# Patient Record
Sex: Female | Born: 1990 | Race: White | Hispanic: No | Marital: Single | Smoking: Never smoker
Health system: Southern US, Community
[De-identification: ages and names within clinical notes are randomized; demographics above are authoritative.]

## PROBLEM LIST (undated history)

## (undated) ENCOUNTER — Emergency Department (HOSPITAL_BASED_OUTPATIENT_CLINIC_OR_DEPARTMENT_OTHER)
Discharge status: Against Medical Advice | Payer: Self-pay | Attending: Emergency Medicine | Admitting: Emergency Medicine

## (undated) DIAGNOSIS — R569 Unspecified convulsions: Secondary | ICD-10-CM

## (undated) HISTORY — PX: OTHER SURGICAL HISTORY: SHX169

## (undated) HISTORY — PX: NO PAST SURGERIES: SHX2092

---
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
## 2014-04-24 ENCOUNTER — Encounter (HOSPITAL_BASED_OUTPATIENT_CLINIC_OR_DEPARTMENT_OTHER): Payer: Self-pay | Admitting: Emergency Medicine

## 2014-04-24 ENCOUNTER — Emergency Department (HOSPITAL_BASED_OUTPATIENT_CLINIC_OR_DEPARTMENT_OTHER)
Admission: EM | Admit: 2014-04-24 | Discharge: 2014-04-24 | Disposition: A | Discharge status: Home / Self Care | Payer: Self-pay | Attending: Emergency Medicine | Admitting: Emergency Medicine

## 2014-04-24 ENCOUNTER — Emergency Department (HOSPITAL_BASED_OUTPATIENT_CLINIC_OR_DEPARTMENT_OTHER): Payer: Self-pay

## 2014-04-24 DIAGNOSIS — R110 Nausea: Secondary | ICD-10-CM | POA: Insufficient documentation

## 2014-04-24 DIAGNOSIS — R259 Unspecified abnormal involuntary movements: Secondary | ICD-10-CM | POA: Insufficient documentation

## 2014-04-24 DIAGNOSIS — R55 Syncope and collapse: Secondary | ICD-10-CM | POA: Insufficient documentation

## 2014-04-24 DIAGNOSIS — Z3202 Encounter for pregnancy test, result negative: Secondary | ICD-10-CM | POA: Insufficient documentation

## 2014-04-24 DIAGNOSIS — R002 Palpitations: Secondary | ICD-10-CM | POA: Insufficient documentation

## 2014-04-24 DIAGNOSIS — R42 Dizziness and giddiness: Secondary | ICD-10-CM | POA: Insufficient documentation

## 2014-04-24 LAB — CBC WITH DIFFERENTIAL/PLATELET
BASOS PCT: 0 % (ref 0–1)
Basophils Absolute: 0 10*3/uL (ref 0.0–0.1)
Eosinophils Absolute: 0.1 10*3/uL (ref 0.0–0.7)
Eosinophils Relative: 1 % (ref 0–5)
HCT: 39.7 % (ref 36.0–46.0)
Hemoglobin: 14.5 g/dL (ref 12.0–15.0)
Lymphocytes Relative: 27 % (ref 12–46)
Lymphs Abs: 2.4 10*3/uL (ref 0.7–4.0)
MCH: 36.5 pg — ABNORMAL HIGH (ref 26.0–34.0)
MCHC: 36.5 g/dL — ABNORMAL HIGH (ref 30.0–36.0)
MCV: 100 fL (ref 78.0–100.0)
Monocytes Absolute: 0.4 10*3/uL (ref 0.1–1.0)
Monocytes Relative: 5 % (ref 3–12)
NEUTROS PCT: 68 % (ref 43–77)
Neutro Abs: 6.2 10*3/uL (ref 1.7–7.7)
Platelets: 244 10*3/uL (ref 150–400)
RBC: 3.97 MIL/uL (ref 3.87–5.11)
RDW: 11.3 % — AB (ref 11.5–15.5)
WBC: 9.1 10*3/uL (ref 4.0–10.5)

## 2014-04-24 LAB — BASIC METABOLIC PANEL
BUN: 11 mg/dL (ref 6–23)
CALCIUM: 10.1 mg/dL (ref 8.4–10.5)
CO2: 22 mEq/L (ref 19–32)
Chloride: 103 mEq/L (ref 96–112)
Creatinine, Ser: 0.8 mg/dL (ref 0.50–1.10)
GFR calc non Af Amer: >90 mL/min (ref >90)
Glucose, Bld: 108 mg/dL — ABNORMAL HIGH (ref 70–99)
POTASSIUM: 3.9 meq/L (ref 3.7–5.3)
Sodium: 139 mEq/L (ref 137–147)

## 2014-04-24 LAB — D-DIMER, QUANTITATIVE: D-Dimer, Quant: 0.42 ug/mL-FEU (ref 0.00–0.48)

## 2014-04-24 LAB — URINALYSIS, ROUTINE W REFLEX MICROSCOPIC
Bilirubin Urine: NEGATIVE
Glucose, UA: NEGATIVE mg/dL
Hgb urine dipstick: NEGATIVE
KETONES UR: NEGATIVE mg/dL
LEUKOCYTES UA: NEGATIVE
NITRITE: NEGATIVE
PH: 6.5 (ref 5.0–8.0)
Protein, ur: NEGATIVE mg/dL
Specific Gravity, Urine: 1.02 (ref 1.005–1.030)
UROBILINOGEN UA: 1 mg/dL (ref 0.0–1.0)

## 2014-04-24 LAB — PREGNANCY, URINE: Preg Test, Ur: NEGATIVE

## 2014-04-24 NOTE — Discharge Instructions (Signed)
Palpitations   A palpitation is the feeling that your heartbeat is irregular or is faster than normal. It may feel like your heart is fluttering or skipping a beat. Palpitations are usually not a serious problem. However, in some cases, you may need further medical evaluation.  CAUSES   Palpitations can be caused by:   Smoking.   Caffeine or other stimulants, such as diet pills or energy drinks.   Alcohol.   Stress and anxiety.   Strenuous physical activity.   Fatigue.   Certain medicines.   Heart disease, especially if you have a history of arrhythmias. This includes atrial fibrillation, atrial flutter, or supraventricular tachycardia.   An improperly working pacemaker or defibrillator.  DIAGNOSIS   To find the cause of your palpitations, your caregiver will take your history and perform a physical exam. Tests may also be done, including:   Electrocardiography (ECG). This test records the heart's electrical activity.   Cardiac monitoring. This allows your caregiver to monitor your heart rate and rhythm in real time.   Holter monitor. This is a portable device that records your heartbeat and can help diagnose heart arrhythmias. It allows your caregiver to track your heart activity for several days, if needed.   Stress tests by exercise or by giving medicine that makes the heart beat faster.  TREATMENT   Treatment of palpitations depends on the cause of your symptoms and can vary greatly. Most cases of palpitations do not require any treatment other than time, relaxation, and monitoring your symptoms. Other causes, such as atrial fibrillation, atrial flutter, or supraventricular tachycardia, usually require further treatment.  HOME CARE INSTRUCTIONS    Avoid:   Caffeinated coffee, tea, soft drinks, diet pills, and energy drinks.   Chocolate.   Alcohol.   Stop smoking if you smoke.   Reduce your stress and anxiety. Things that can help you relax include:   A method that measures bodily functions so  you can learn to control them (biofeedback).   Yoga.   Meditation.   Physical activity such as swimming, jogging, or walking.   Get plenty of rest and sleep.  SEEK MEDICAL CARE IF:    You continue to have a fast or irregular heartbeat beyond 24 hours.   Your palpitations occur more often.  SEEK IMMEDIATE MEDICAL CARE IF:   You develop chest pain or shortness of breath.   You have a severe headache.   You feel dizzy, or you faint.  MAKE SURE YOU:   Understand these instructions.   Will watch your condition.   Will get help right away if you are not doing well or get worse.  Document Released: 10/24/2000 Document Revised: 02/21/2013 Document Reviewed: 12/26/2011  [employer] Patient Information 2014 [employer].

## 2014-04-24 NOTE — ED Notes (Signed)
Pt c/o palpitations x 1 hr, pt reports witnessed syncopal episode x 3 days ago with seizure like activity.

## 2014-04-24 NOTE — ED Provider Notes (Signed)
CSN: [id number]     Arrival date & time 04/24/14  1517 History  This chart was scribed for Jamie Batonourtney F Horton, MD by Charline BillsEssence Howell, ED Scribe. The patient was seen in room MH09/MH09. Patient's care was started at 3:39 PM.   Chief Complaint  Patient presents with  . Palpitations   The history is provided by the patient. No language interpreter was used.   HPI Comments: Jamie Shaw is a 23 y.o. female who presents to the Emergency Department complaining of intermittent palpations over the past hour. Pt states that she has been feeling ill since Saturday. She states that it feels like her "heart could beat out of her chest". Pt reports a mild episode PTA as well as episodes on Saturday that lasted for a few minutes. She states that her "arms and legs were feeling like noodles" with assoictaed tremors. She denies chest pain and SOB. Nothing exacerbates or improves her symptoms. Pt was given an Excedrin Migraine for HA on Saturday. Denies HA now.  She states that she felt very weak but denies LOC. Pt reports stress with school and waiting to see if she has been accepted into college. Pt also reports a h/o HA with her last one being Saturday with associated nausea. Pt tried drinking a Mr. Pibb and taking Excedrin for relief. She states that she has cut back on caffeine intake. Pt denies possible pregnancy. LMP last month.   History reviewed. No pertinent past medical history. History reviewed. No pertinent past surgical history. History reviewed. No pertinent family history. History  Substance Use Topics  . Smoking status: Never Smoker   . Smokeless tobacco: Not on file  . Alcohol Use: No   OB History   Grav Para Term Preterm Abortions TAB SAB Ect Mult Living                 Review of Systems  Constitutional: Negative for fever.  Respiratory: Negative for cough, chest tightness and shortness of breath.   Cardiovascular: Positive for palpitations. Negative for chest pain and leg swelling.   Gastrointestinal: Negative for nausea, vomiting and abdominal pain.  Genitourinary: Negative for dysuria.  Musculoskeletal: Negative for back pain.  Skin: Negative for rash.  Neurological: Positive for dizziness. Negative for headaches.       Near syncope  Psychiatric/Behavioral: Negative for confusion.       Increased stress  All other systems reviewed and are negative.   Allergies  Review of patient's allergies indicates no known allergies.  Home Medications   Prior to Admission medications   Not on File   Triage Vitals: BP 121/78  Pulse 94  Temp(Src) 98.3 F (36.8 C)  Resp 16  Ht 5\' 1"  (1.549 m)  Wt 130 lb (58.968 kg)  BMI 24.58 kg/m2  SpO2 100%  LMP 04/05/2014 Physical Exam  Nursing note and vitals reviewed. Constitutional: She is oriented to person, place, and time. She appears well-developed and well-nourished. No distress.  HENT:  Head: Normocephalic and atraumatic.  Cardiovascular: Normal rate, regular rhythm and normal heart sounds.   No murmur heard. Pulmonary/Chest: Effort normal and breath sounds normal. No respiratory distress. She has no wheezes.  Abdominal: Soft. Bowel sounds are normal. There is no tenderness. There is no rebound.  Musculoskeletal: She exhibits no edema.  Neurological: She is alert and oriented to person, place, and time.  Skin: Skin is warm and dry.  Psychiatric: She has a normal mood and affect.    ED Course  Procedures (  including critical care time) DIAGNOSTIC STUDIES: Oxygen Saturation is 94% on RA, adequate by my interpretation.    COORDINATION OF CARE: 3:46 PM-Discussed treatment plan with pt at bedside and pt agreed to plan.   Labs Review Labs Reviewed  CBC WITH DIFFERENTIAL - Abnormal; Notable for the following:    MCH 36.5 (*)    MCHC 36.5 (*)    RDW 11.3 (*)    All other components within normal limits  BASIC METABOLIC PANEL - Abnormal; Notable for the following:    Glucose, Bld 108 (*)    All other components  within normal limits  URINALYSIS, ROUTINE W REFLEX MICROSCOPIC  PREGNANCY, URINE  D-DIMER, QUANTITATIVE   Imaging Review Dg Chest 2 View  04/24/2014   CLINICAL DATA:  Palpitations and extremity weakness  EXAM: CHEST  2 VIEW  COMPARISON:  None.  FINDINGS: The lungs are well-expanded and clear. The heart and mediastinal structures are normal. There is no pleural effusion or pneumothorax. The bony thorax is unremarkable.  IMPRESSION: There is no acute cardiopulmonary disease.   Electronically Signed   By: David  [place]   On: 04/24/2014 16:41    EKG Interpretation   Date/Time:  Monday April 24 2014 15:42:58 EDT Ventricular Rate:  99 PR Interval:  138 QRS Duration: 66 QT Interval:  338 QTC Calculation: 433 R Axis:   30 Text Interpretation:  Normal sinus rhythm with sinus arrhythmia Normal ECG  Confirmed by HORTON  MD, Toni AmendOURTNEY ([id number]) on 04/24/2014 4:34:00 PM      MDM   Final diagnoses:  Palpitations    Patient presents with 2 episodes of palpitations. One on Saturday and 1 prior to arrival. She's currently at her baseline denies any symptoms. She had no associated chest pain or shortness of breath. Patient does report presyncopal episode on Saturday. She states in general she is tried to cut back on her caffeine intake but did take Excedrin Migraine and a Mr. Pibb on Saturday prior to the episode. She's nontoxic on exam and vital signs are reassuring. Sinus rhythm on EKG. Workup is reassuring including d-dimer. Patient has not had recurrence of episode here. Discussed with patient avoiding caffeine and trying to decrease stressors. If she has recurrence of symptoms she may need to wear a Holter monitor.  [facility].  After history, exam, and medical workup I feel the patient has been appropriately medically screened and is safe for discharge home. Pertinent diagnoses were discussed with the patient. Patient was given return precautions.   I personally performed the  services described in this documentation, which was scribed in my presence. The recorded information has been reviewed and is accurate.    Jamie Batonourtney F Horton, MD 04/24/14 [phone]

## 2015-12-24 ENCOUNTER — Encounter (HOSPITAL_COMMUNITY): Payer: Self-pay | Admitting: Emergency Medicine

## 2015-12-24 ENCOUNTER — Emergency Department (HOSPITAL_COMMUNITY)
Admission: EM | Admit: 2015-12-24 | Discharge: 2015-12-24 | Disposition: A | Discharge status: Home / Self Care | Payer: BLUE CROSS/BLUE SHIELD | Attending: Emergency Medicine | Admitting: Emergency Medicine

## 2015-12-24 DIAGNOSIS — R112 Nausea with vomiting, unspecified: Secondary | ICD-10-CM | POA: Diagnosis not present

## 2015-12-24 DIAGNOSIS — Z3202 Encounter for pregnancy test, result negative: Secondary | ICD-10-CM | POA: Insufficient documentation

## 2015-12-24 DIAGNOSIS — R1030 Lower abdominal pain, unspecified: Secondary | ICD-10-CM | POA: Insufficient documentation

## 2015-12-24 HISTORY — DX: Unspecified convulsions: R56.9

## 2015-12-24 LAB — COMPREHENSIVE METABOLIC PANEL
ALBUMIN: 3.9 g/dL (ref 3.5–5.0)
ALK PHOS: 63 U/L (ref 38–126)
ALT: 20 U/L (ref 14–54)
ANION GAP: 12 (ref 5–15)
AST: 20 U/L (ref 15–41)
BUN: 10 mg/dL (ref 6–20)
CALCIUM: 9.2 mg/dL (ref 8.9–10.3)
CO2: 25 mmol/L (ref 22–32)
Chloride: 101 mmol/L (ref 101–111)
Creatinine, Ser: 0.76 mg/dL (ref 0.44–1.00)
GFR calc Af Amer: >60 mL/min (ref >60)
GFR calc non Af Amer: >60 mL/min (ref >60)
GLUCOSE: 103 mg/dL — AB (ref 65–99)
POTASSIUM: 3.6 mmol/L (ref 3.5–5.1)
SODIUM: 138 mmol/L (ref 135–145)
TOTAL PROTEIN: 7.4 g/dL (ref 6.5–8.1)
Total Bilirubin: 1.1 mg/dL (ref 0.3–1.2)

## 2015-12-24 LAB — CBC
HCT: 38.9 % (ref 36.0–46.0)
Hemoglobin: 13.1 g/dL (ref 12.0–15.0)
MCH: 34.1 pg — AB (ref 26.0–34.0)
MCHC: 33.7 g/dL (ref 30.0–36.0)
MCV: 101.3 fL — ABNORMAL HIGH (ref 78.0–100.0)
Platelets: 283 10*3/uL (ref 150–400)
RBC: 3.84 MIL/uL — AB (ref 3.87–5.11)
RDW: 11.9 % (ref 11.5–15.5)
WBC: 8.1 10*3/uL (ref 4.0–10.5)

## 2015-12-24 LAB — URINALYSIS, ROUTINE W REFLEX MICROSCOPIC
Glucose, UA: NEGATIVE mg/dL
HGB URINE DIPSTICK: NEGATIVE
Ketones, ur: NEGATIVE mg/dL
Leukocytes, UA: NEGATIVE
Nitrite: NEGATIVE
PROTEIN: NEGATIVE mg/dL
Specific Gravity, Urine: 1.037 — ABNORMAL HIGH (ref 1.005–1.030)
pH: 5.5 (ref 5.0–8.0)

## 2015-12-24 LAB — POC URINE PREG, ED: PREG TEST UR: NEGATIVE

## 2015-12-24 LAB — LIPASE, BLOOD: Lipase: 32 U/L (ref 11–51)

## 2015-12-24 MED ORDER — ONDANSETRON 4 MG PO TBDP
4.0000 mg | ORAL_TABLET | Freq: Once | ORAL | Status: AC
  Administered 2015-12-24: 4 mg via ORAL
  Filled 2015-12-24 (×2): qty 1

## 2015-12-24 MED ORDER — ONDANSETRON 4 MG PO TBDP: 4.0000 mg | ORAL_TABLET | Freq: Three times a day (TID) | ORAL | Status: DC | PRN

## 2015-12-24 MED ORDER — ONDANSETRON HCL 4 MG/2ML IJ SOLN: 4.0000 mg | Freq: Once | INTRAMUSCULAR | Status: DC

## 2015-12-24 MED ORDER — SODIUM CHLORIDE 0.9 % IV BOLUS (SEPSIS): 1000.0000 mL | Freq: Once | INTRAVENOUS | Status: DC

## 2015-12-24 NOTE — ED Notes (Signed)
Pt. reports low abdominal pain with nausea and emesis onset this morning , denies diarrhea , no fever or chills. Seen at an urgent care today with no findings .

## 2015-12-24 NOTE — ED Provider Notes (Signed)
CSN: [id number]     Arrival date & time 12/24/15  2047 History   First MD Initiated Contact with Patient 12/24/15 2204     Chief Complaint  Patient presents with  . Abdominal Pain    HPI   Jamie Shaw is an 25 y.o. female with history of seizures who presents to the ED for evaluation of abdominal pain. States she was in her usual state of health until this morning when she started experiencing lower abdominal pain with associated nausea. She states the pain comes and goes. She states she saw a [facility] who told her to take ibuprofen and ordered outpatient ultrasound for tomorrow. Pt states she took ibuprofen which made her throw up. She reports two episodes of NBNB emesis today. She states now in the ED her pain and nausea have resolved. She feels back to baseline. Denies dysuria, urinary frequency/urgency. Denies diarrhea. Denies fever or chills. Denies sick contacts or recent travel.   Past Medical History  Diagnosis Date  . Seizures (HCC)    History reviewed. No pertinent past surgical history. No family history on file. Social History  Substance Use Topics  . Smoking status: Never Smoker   . Smokeless tobacco: None  . Alcohol Use: No   OB History    No data available     Review of Systems  All other systems reviewed and are negative.     Allergies  Review of patient's allergies indicates no known allergies.  Home Medications   Prior to Admission medications   Not on File   BP 121/74 mmHg  Pulse 108  Temp(Src) 98.3 F (36.8 C) (Oral)  Resp 16  Ht  (1.549 m)  Wt 68.947 kg  BMI 28.74 kg/m2  SpO2 99%  LMP 12/05/2015 (Approximate) Physical Exam  Constitutional: She is oriented to person, place, and time. No distress.  HENT:  Head: Atraumatic.  Right Ear: External ear normal.  Left Ear: External ear normal.  Nose: Nose normal.  Eyes: Conjunctivae are normal. No scleral icterus.  Neck: Normal range of motion. Neck supple.  Cardiovascular: Normal rate and regular  rhythm.   Pulmonary/Chest: Effort normal. No respiratory distress. She exhibits no tenderness.  Abdominal: Soft. She exhibits no distension. There is no tenderness. There is no rebound, no guarding and no CVA tenderness.  Neurological: She is alert and oriented to person, place, and time.  Skin: Skin is warm and dry. She is not diaphoretic.  Psychiatric: She has a normal mood and affect. Her behavior is normal.  Nursing note and vitals reviewed.   ED Course  Procedures (including critical care time) Labs Review Labs Reviewed  COMPREHENSIVE METABOLIC PANEL - Abnormal; Notable for the following:    Glucose, Bld 103 (*)    All other components within normal limits  CBC - Abnormal; Notable for the following:    RBC 3.84 (*)    MCV 101.3 (*)    MCH 34.1 (*)    All other components within normal limits  URINALYSIS, ROUTINE W REFLEX MICROSCOPIC (NOT AT [facility]) - Abnormal; Notable for the following:    Color, Urine AMBER (*)    APPearance CLOUDY (*)    Specific Gravity, Urine 1.037 (*)    Bilirubin Urine SMALL (*)    All other components within normal limits  LIPASE, BLOOD  POC URINE PREG, ED    Imaging Review No results found. I have personally reviewed and evaluated these images and lab results as part of my medical decision-making.  EKG Interpretation None      MDM   Final diagnoses:  Lower abdominal pain  Non-intractable vomiting with nausea, vomiting of unspecified type    Pt with nonfocal exam. She is afebrile. No abdominal tenderness, rigidity, or guarding. Not a surgical abdomen. Labs unremarkable. Pt declines IV fluids. Will give Zofran ODT and fluid challenge. Anticipate d/c home with outpatient f/u. She may continue with scheduled outpatient [place] but no indication for emergent imaging tonight.  Pt able to tolerate PO. Rx given for Zofran ODT. Instructed to f/u as an outpatient as scheduled. ER return precautions given.  Carlene Coria, PA-C 12/25/15 1450  Doug Sou, MD 12/25/15 2340

## 2015-12-24 NOTE — Discharge Instructions (Signed)
Your labs today were normal. I gave you a prescription for nausea medicine. You may take Tylenol as needed for pain. Avoid ibuprofen as it can upset your stomach worse. Please follow up with your primary care provider this week. You may keep your appointment tomorrow for your ultrasound. Return to the ER for new or worsening symptoms.

## 2015-12-24 NOTE — ED Notes (Signed)
Pt ambulates independently and with steady gait at time of discharge. Discharge instructions and follow up information reviewed with patient. No other questions or concerns voiced at this time. RX x 1 given. 

## 2016-04-08 DIAGNOSIS — G40409 Other generalized epilepsy and epileptic syndromes, not intractable, without status epilepticus: Secondary | ICD-10-CM | POA: Insufficient documentation

## 2018-06-18 ENCOUNTER — Other Ambulatory Visit: Payer: Self-pay

## 2018-06-18 ENCOUNTER — Emergency Department (HOSPITAL_COMMUNITY)
Admission: EM | Admit: 2018-06-18 | Discharge: 2018-06-18 | Disposition: A | Discharge status: Home / Self Care | Payer: BLUE CROSS/BLUE SHIELD | Attending: Emergency Medicine | Admitting: Emergency Medicine

## 2018-06-18 ENCOUNTER — Encounter (HOSPITAL_COMMUNITY): Payer: Self-pay

## 2018-06-18 DIAGNOSIS — R569 Unspecified convulsions: Secondary | ICD-10-CM | POA: Insufficient documentation

## 2018-06-18 DIAGNOSIS — I499 Cardiac arrhythmia, unspecified: Secondary | ICD-10-CM | POA: Diagnosis not present

## 2018-06-18 DIAGNOSIS — R457 State of emotional shock and stress, unspecified: Secondary | ICD-10-CM | POA: Diagnosis not present

## 2018-06-18 DIAGNOSIS — W19XXXA Unspecified fall, initial encounter: Secondary | ICD-10-CM | POA: Diagnosis not present

## 2018-06-18 DIAGNOSIS — G40909 Epilepsy, unspecified, not intractable, without status epilepticus: Secondary | ICD-10-CM | POA: Diagnosis not present

## 2018-06-18 LAB — CBC WITH DIFFERENTIAL/PLATELET
BASOS ABS: 0 10*3/uL (ref 0.0–0.1)
Basophils Relative: 0 %
EOS ABS: 0.1 10*3/uL (ref 0.0–0.7)
EOS PCT: 1 %
HCT: 40.6 % (ref 36.0–46.0)
Hemoglobin: 14.4 g/dL (ref 12.0–15.0)
Lymphocytes Relative: 31 %
Lymphs Abs: 2.4 10*3/uL (ref 0.7–4.0)
MCH: 36.1 pg — ABNORMAL HIGH (ref 26.0–34.0)
MCHC: 35.5 g/dL (ref 30.0–36.0)
MCV: 101.8 fL — ABNORMAL HIGH (ref 78.0–100.0)
MONO ABS: 0.4 10*3/uL (ref 0.1–1.0)
Monocytes Relative: 5 %
Neutro Abs: 4.8 10*3/uL (ref 1.7–7.7)
Neutrophils Relative %: 63 %
PLATELETS: 247 10*3/uL (ref 150–400)
RBC: 3.99 MIL/uL (ref 3.87–5.11)
RDW: 11.5 % (ref 11.5–15.5)
WBC: 7.7 10*3/uL (ref 4.0–10.5)

## 2018-06-18 LAB — RAPID URINE DRUG SCREEN, HOSP PERFORMED
AMPHETAMINES: NOT DETECTED
Barbiturates: NOT DETECTED
Benzodiazepines: NOT DETECTED
Cocaine: NOT DETECTED
OPIATES: NOT DETECTED
Tetrahydrocannabinol: POSITIVE — AB

## 2018-06-18 LAB — CBG MONITORING, ED
GLUCOSE-CAPILLARY: 118 mg/dL — AB (ref 70–99)
Glucose-Capillary: 113 mg/dL — ABNORMAL HIGH (ref 70–99)

## 2018-06-18 LAB — I-STAT BETA HCG BLOOD, ED (MC, WL, AP ONLY): I-stat hCG, quantitative: <5 m[IU]/mL (ref <5)

## 2018-06-18 LAB — COMPREHENSIVE METABOLIC PANEL
ALK PHOS: 39 U/L (ref 38–126)
ALT: 21 U/L (ref 0–44)
AST: 17 U/L (ref 15–41)
Albumin: 4.6 g/dL (ref 3.5–5.0)
Anion gap: 8 (ref 5–15)
BILIRUBIN TOTAL: 0.7 mg/dL (ref 0.3–1.2)
BUN: 10 mg/dL (ref 6–20)
CO2: 24 mmol/L (ref 22–32)
CREATININE: 0.6 mg/dL (ref 0.44–1.00)
Calcium: 9.7 mg/dL (ref 8.9–10.3)
Chloride: 109 mmol/L (ref 98–111)
GFR calc Af Amer: >60 mL/min (ref >60)
Glucose, Bld: 107 mg/dL — ABNORMAL HIGH (ref 70–99)
Potassium: 3.9 mmol/L (ref 3.5–5.1)
Sodium: 141 mmol/L (ref 135–145)
TOTAL PROTEIN: 7.4 g/dL (ref 6.5–8.1)

## 2018-06-18 LAB — ETHANOL: Alcohol, Ethyl (B): <10 mg/dL (ref <10)

## 2018-06-18 MED ORDER — LEVETIRACETAM 500 MG PO TABS
1000.0000 mg | ORAL_TABLET | Freq: Once | ORAL | Status: AC
  Administered 2018-06-18: 1000 mg via ORAL
  Filled 2018-06-18: qty 2

## 2018-06-18 MED ORDER — SODIUM CHLORIDE 0.9 % IV BOLUS
1000.0000 mL | Freq: Once | INTRAVENOUS | Status: AC
  Administered 2018-06-18: 1000 mL via INTRAVENOUS

## 2018-06-18 MED ORDER — LEVETIRACETAM 500 MG PO TABS: ORAL_TABLET | ORAL | 0 refills | Status: DC

## 2018-06-18 NOTE — ED Provider Notes (Signed)
[facility]-EMERGENCY DEPT Provider Note   CSN: [id number] Arrival date & time: 06/18/18  1953     History   Chief Complaint Chief Complaint  Patient presents with  . Seizures    HPI Jamie Shaw is a 27 y.o. female.  HPI   Jamie Shaw is a 27 y.o. female, with a history of seizures, presenting to the ED with recurrence of her seizures which she refers to as "episodes." During her "episodes" patient states her legs feel weak, she "feels weird all over," feels weak, and "sometimes shakes." Episodes last a couple of minutes.  She had two of these episodes today, one this morning and one this evening.  This evening, patient's fianc states patient laid down on the floor, curled up into the fetal position, and started shaking in her hands, arms, and jaw.  She states she has not had a seizure in a few years.  She voices compliance with her medications.  Denies fever, N/V/D, incontinence, recent illness, headache, abdominal pain, chest pain, shortness of breath, neck/back pain, or any other complaints.   Past Medical History:  Diagnosis Date  . Seizures (HCC)     There are no active problems to display for this patient.   History reviewed. No pertinent surgical history.   OB History   None      Home Medications    Prior to Admission medications   Medication Sig Start Date End Date Taking? Authorizing Provider  levETIRAcetam (KEPPRA) 500 MG tablet Take 500 mg by mouth 2 (two) times daily.  06/17/18  Yes [provider]  levETIRAcetam (KEPPRA) 500 MG tablet Take 1 tablet (500 mg) in the morning and 2 tablets (1000 mg) in the evenings. 06/18/18   Joy, Shawn C, PA-C  ondansetron (ZOFRAN ODT) 4 MG disintegrating tablet Take 1 tablet (4 mg total) by mouth every 8 (eight) hours as needed for nausea or vomiting. Patient not taking: Reported on 06/18/2018 12/24/15   Carlene Coria, PA-C    Family History History reviewed. No pertinent family  history.  Social History Social History   Tobacco Use  . Smoking status: Never Smoker  Substance Use Topics  . Alcohol use: No  . Drug use: Yes    Types: Marijuana     Allergies   Patient has no known allergies.   Review of Systems Review of Systems  Constitutional: Negative for chills, diaphoresis and fever.  Respiratory: Negative for shortness of breath.   Cardiovascular: Negative for chest pain.  Gastrointestinal: Negative for abdominal pain, diarrhea, nausea and vomiting.  Neurological: Positive for seizures (seizure-like activity). Negative for dizziness, syncope, weakness, light-headedness, numbness and headaches.  All other systems reviewed and are negative.    Physical Exam Updated Vital Signs BP 119/66 (BP Location: Left Arm)   Pulse 82   Temp 98.3 F (36.8 C) (Oral)   Resp 17   SpO2 99%   Physical Exam  Constitutional: She is oriented to person, place, and time. She appears well-developed and well-nourished. No distress.  HENT:  Head: Normocephalic and atraumatic.  Eyes: Pupils are equal, round, and reactive to light. Conjunctivae and EOM are normal.  Neck: Neck supple.  Cardiovascular: Normal rate, regular rhythm, normal heart sounds and intact distal pulses.  Pulmonary/Chest: Effort normal and breath sounds normal. No respiratory distress.  Abdominal: Soft. There is no tenderness. There is no guarding.  Musculoskeletal: She exhibits no edema.  Lymphadenopathy:    She has no cervical adenopathy.  Neurological:  She is alert and oriented to person, place, and time.  Sensation grossly intact in the extremities. No noted speech deficits. No aphasia. Patient handles oral secretions without difficulty. No noted swallowing defects.  Equal grip strength bilaterally. Strength 5/5 in the upper extremities. Strength 5/5 with flexion and extension of the hips, knees, and ankles bilaterally.  Negative Romberg. No gait disturbance.  Coordination intact including  heel to shin and finger to nose.  Cranial nerves III-XII grossly intact.  No facial droop.   Skin: Skin is warm and dry. She is not diaphoretic.  Psychiatric: She has a normal mood and affect. Her behavior is normal.  Nursing note and vitals reviewed.    ED Treatments / Results  Labs (all labs ordered are listed, but only abnormal results are displayed) Labs Reviewed  COMPREHENSIVE METABOLIC PANEL - Abnormal; Notable for the following components:      Result Value   Glucose, Bld 107 (*)    All other components within normal limits  CBC WITH DIFFERENTIAL/PLATELET - Abnormal; Notable for the following components:   MCV 101.8 (*)    MCH 36.1 (*)    All other components within normal limits  RAPID URINE DRUG SCREEN, HOSP PERFORMED - Abnormal; Notable for the following components:   Tetrahydrocannabinol POSITIVE (*)    All other components within normal limits  CBG MONITORING, ED - Abnormal; Notable for the following components:   Glucose-Capillary 118 (*)    All other components within normal limits  CBG MONITORING, ED - Abnormal; Notable for the following components:   Glucose-Capillary 113 (*)    All other components within normal limits  ETHANOL  I-STAT BETA HCG BLOOD, ED ([facility], [facility], [facility] ONLY)    EKG None  Radiology No results found.  Procedures Procedures (including critical care time)  Medications Ordered in ED Medications  sodium chloride 0.9 % bolus 1,000 mL (1,000 mLs Intravenous New Bag/Given 06/18/18 2152)  levETIRAcetam (KEPPRA) tablet 1,000 mg (has no administration in time range)     Initial Impression / Assessment and Plan / ED Course  I have reviewed the triage vital signs and the nursing notes.  Pertinent labs & imaging results that were available during my care of the patient were reviewed by me and considered in my medical decision making (see chart for details).  Clinical Course as of Jun 19 2231  Fri Jun 18, 2018  2140 Spoke with Dr. Alton Revere,  neurologist on-call for Dr. Karle Plumber office.  Advises patient can increase her Keppra from 500 mg twice daily to 500 mg in the morning and 1000 mg in the evening.  Call on Monday to make an appointment with Dr. Windle Guard.   [SJ]    Clinical Course User Index [SJ] Joy, Shawn C, PA-C    Patient presents with what she describes as a possible seizure.  The description sounds atypical for epileptic seizure.  Regardless, we will increase the patient's Keppra dosing, place her on seizure precautions, and she will follow-up with her neurologist.  Return precautions discussed.  Patient voices understanding of these instructions, accepts the plan, and is comfortable with discharge.  Vitals:   06/18/18 2007 06/18/18 2150  BP: 119/66 105/86  Pulse: 82 80  Resp: 17 17  Temp: 98.3 F (36.8 C)   TempSrc: Oral   SpO2: 99% 98%     Final Clinical Impressions(s) / ED Diagnoses   Final diagnoses:  Seizure-like activity [facility])    ED Discharge Orders  Ordered    levETIRAcetam (KEPPRA) 500 MG tablet     06/18/18 2225           Concepcion LivingJoy, Shawn C, PA-C 06/18/18 2234    Mancel BaleWentz, Elliott, MD 06/18/18 2311

## 2018-06-18 NOTE — ED Triage Notes (Signed)
Pt reports seizure-like activity at work today. She is able to recall everything that happened and denies LOC. EMS reports hyperventilation initially, but that has subsided. Pt is A&Ox4. She reports compliance with Keppra. Hx of seizures and an arrhythmia (she is unsure what type).

## 2018-06-18 NOTE — ED Notes (Signed)
Bed: WHALD Expected date:  Expected time:  Means of arrival:  Comments: 

## 2018-06-18 NOTE — ED Notes (Signed)
Bed: WA18 Expected date:  Expected time:  Means of arrival:  Comments: Hall D 

## 2018-06-18 NOTE — Discharge Instructions (Addendum)
Lab results were reassuring.  Call your neurologist office on Monday, August 12 to set up an appointment.  We will be modifying your Keppra.  Starting tomorrow morning, please take 1 tablet (500 mg) in the morning and 2 tablets (1000 mg) in the evening, until told otherwise by your neurologist.  [place], patients with seizures are not allowed to drive until  they have been seizure-free for six months. Use caution when using heavy equipment or power tools. Avoid working on ladders or at heights. Take showers instead of baths. Ensure the water temperature is not too high on the home water heater. Do not go swimming alone. When caring for infants or small children, sit down when holding, feeding, or changing them to minimize risk of injury to the child in the event you have a seizure.   Also, Maintain good sleep hygiene. Avoid alcohol.   --> Call 911 and bring the patient back to the ED if:                   A.  The seizure lasts longer than 5 minutes.                  B.  The patient doesn't awaken shortly after the seizure             C.  The patient has new problems such as difficulty seeing, speaking or moving             D.  The patient was injured during the seizure             E.  The patient has a temperature over 102 F (39C)             F.  The patient vomited and now is having trouble breathing

## 2018-06-18 NOTE — ED Notes (Signed)
ED Provider at bedside. 

## 2018-06-22 DIAGNOSIS — G40409 Other generalized epilepsy and epileptic syndromes, not intractable, without status epilepticus: Secondary | ICD-10-CM | POA: Diagnosis not present

## 2018-06-22 DIAGNOSIS — Z79899 Other long term (current) drug therapy: Secondary | ICD-10-CM | POA: Diagnosis not present

## 2018-06-28 DIAGNOSIS — G40409 Other generalized epilepsy and epileptic syndromes, not intractable, without status epilepticus: Secondary | ICD-10-CM | POA: Diagnosis not present

## 2018-07-20 DIAGNOSIS — G40409 Other generalized epilepsy and epileptic syndromes, not intractable, without status epilepticus: Secondary | ICD-10-CM | POA: Diagnosis not present

## 2019-08-08 DIAGNOSIS — R634 Abnormal weight loss: Secondary | ICD-10-CM | POA: Diagnosis not present

## 2019-08-08 DIAGNOSIS — G40409 Other generalized epilepsy and epileptic syndromes, not intractable, without status epilepticus: Secondary | ICD-10-CM | POA: Diagnosis not present

## 2020-03-26 ENCOUNTER — Other Ambulatory Visit: Payer: Self-pay

## 2020-03-26 ENCOUNTER — Ambulatory Visit (INDEPENDENT_AMBULATORY_CARE_PROVIDER_SITE_OTHER): Payer: BC Managed Care – PPO | Admitting: Family Medicine

## 2020-03-26 ENCOUNTER — Encounter: Payer: Self-pay | Admitting: Family Medicine

## 2020-03-26 VITALS — BP 100/64 | HR 78 | Temp 97.9°F | Ht 62.0 in | Wt 110.6 lb

## 2020-03-26 DIAGNOSIS — F418 Other specified anxiety disorders: Secondary | ICD-10-CM | POA: Diagnosis not present

## 2020-03-26 DIAGNOSIS — Z0000 Encounter for general adult medical examination without abnormal findings: Secondary | ICD-10-CM | POA: Diagnosis not present

## 2020-03-26 DIAGNOSIS — G40909 Epilepsy, unspecified, not intractable, without status epilepticus: Secondary | ICD-10-CM | POA: Insufficient documentation

## 2020-03-26 DIAGNOSIS — M26609 Unspecified temporomandibular joint disorder, unspecified side: Secondary | ICD-10-CM | POA: Diagnosis not present

## 2020-03-26 NOTE — Patient Instructions (Signed)
Health Maintenance, Female Adopting a healthy lifestyle and getting preventive care are important in promoting health and wellness. Ask your health care provider about:  The right schedule for you to have regular tests and exams.  Things you can do on your own to prevent diseases and keep yourself healthy. What should I know about diet, weight, and exercise? Eat a healthy diet   Eat a diet that includes plenty of vegetables, fruits, low-fat dairy products, and lean protein.  Do not eat a lot of foods that are high in solid fats, added sugars, or sodium. Maintain a healthy weight Body mass index (BMI) is used to identify weight problems. It estimates body fat based on height and weight. Your health care provider can help determine your BMI and help you achieve or maintain a healthy weight. Get regular exercise Get regular exercise. This is one of the most important things you can do for your health. Most adults should:  Exercise for at least 150 minutes each week. The exercise should increase your heart rate and make you sweat (moderate-intensity exercise).  Do strengthening exercises at least twice a week. This is in addition to the moderate-intensity exercise.  Spend less time sitting. Even light physical activity can be beneficial. Watch cholesterol and blood lipids Have your blood tested for lipids and cholesterol at 27 years of age, then have this test every 5 years. Have your cholesterol levels checked more often if:  Your lipid or cholesterol levels are high.  You are older than 27 years of age.  You are at high risk for heart disease. What should I know about cancer screening? Depending on your health history and family history, you may need to have cancer screening at various ages. This may include screening for:  Breast cancer.  Cervical cancer.  Colorectal cancer.  Skin cancer.  Lung cancer. What should I know about heart disease, diabetes, and high blood  pressure? Blood pressure and heart disease  High blood pressure causes heart disease and increases the risk of stroke. This is more likely to develop in people who have high blood pressure readings, are of African descent, or are overweight.  Have your blood pressure checked: ? Every 3-5 years if you are 54-62 years of age. ? Every year if you are 27 years old or older. Diabetes Have regular diabetes screenings. This checks your fasting blood sugar level. Have the screening done:  Once every three years after age 69 if you are at a normal weight and have a low risk for diabetes.  More often and at a younger age if you are overweight or have a high risk for diabetes. What should I know about preventing infection? Hepatitis B If you have a higher risk for hepatitis B, you should be screened for this virus. Talk with your health care provider to find out if you are at risk for hepatitis B infection. Hepatitis C Testing is recommended for:  Everyone born from [birth year] through 1963.  Anyone with known risk factors for hepatitis C. Sexually transmitted infections (STIs)  Get screened for STIs, including gonorrhea and chlamydia, if: ? You are sexually active and are younger than 27 years of age. ? You are older than 27 years of age and your health care provider tells you that you are at risk for this type of infection. ? Your sexual activity has changed since you were last screened, and you are at increased risk for chlamydia or gonorrhea. Ask your health care provider if  you are at risk.  Ask your health care provider about whether you are at high risk for HIV. Your health care provider may recommend a prescription medicine to help prevent HIV infection. If you choose to take medicine to prevent HIV, you should first get tested for HIV. You should then be tested every 3 months for as long as you are taking the medicine. Pregnancy  If you are about to stop having your period (premenopausal) and  you may become pregnant, seek counseling before you get pregnant.  Take 400 to 800 micrograms (mcg) of folic acid every day if you become pregnant.  Ask for birth control (contraception) if you want to prevent pregnancy. Osteoporosis and menopause Osteoporosis is a disease in which the bones lose minerals and strength with aging. This can result in bone fractures. If you are 9 years old or older, or if you are at risk for osteoporosis and fractures, ask your health care provider if you should:  Be screened for bone loss.  Take a calcium or vitamin D supplement to lower your risk of fractures.  Be given hormone replacement therapy (HRT) to treat symptoms of menopause. Follow these instructions at home: Lifestyle  Do not use any products that contain nicotine or tobacco, such as cigarettes, e-cigarettes, and chewing tobacco. If you need help quitting, ask your health care provider.  Do not use street drugs.  Do not share needles.  Ask your health care provider for help if you need support or information about quitting drugs. Alcohol use  Do not drink alcohol if: ? Your health care provider tells you not to drink. ? You are pregnant, may be pregnant, or are planning to become pregnant.  If you drink alcohol: ? Limit how much you use to 0-1 drink a day. ? Limit intake if you are breastfeeding.  Be aware of how much alcohol is in your drink. In the [place] one drink equals one 12 oz bottle of beer (355 mL), one 5 oz glass of wine (148 mL), or one 1 oz glass of hard liquor (44 mL). General instructions  Schedule regular health, dental, and eye exams.  Stay current with your vaccines.  Tell your health care provider if: ? You often feel depressed. ? You have ever been abused or do not feel safe at home. Summary  Adopting a healthy lifestyle and getting preventive care are important in promoting health and wellness.  Follow your health care provider's instructions about healthy  diet, exercising, and getting tested or screened for diseases.  Follow your health care provider's instructions on monitoring your cholesterol and blood pressure. This information is not intended to replace advice given to you by your health care provider. Make sure you discuss any questions you have with your health care provider. Document Revised: 10/20/2018 Document Reviewed: 10/20/2018 Elsevier Patient Education  2020 [place] 93-58 Years Old, Female Preventive care refers to visits with your health care provider and lifestyle choices that can promote health and wellness. This includes:  A yearly physical exam. This may also be called an annual well check.  Regular dental visits and eye exams.  Immunizations.  Screening for certain conditions.  Healthy lifestyle choices, such as eating a healthy diet, getting regular exercise, not using drugs or products that contain nicotine and tobacco, and limiting alcohol use. What can I expect for my preventive care visit? Physical exam Your health care provider will check your:  Height and weight. This may be used  to calculate body mass index (BMI), which tells if you are at a healthy weight.  Heart rate and blood pressure.  Skin for abnormal spots. Counseling Your health care provider may ask you questions about your:  Alcohol, tobacco, and drug use.  Emotional well-being.  Home and relationship well-being.  Sexual activity.  Eating habits.  Work and work Statistician.  Method of birth control.  Menstrual cycle.  Pregnancy history. What immunizations do I need?  Influenza (flu) vaccine  This is recommended every year. Tetanus, diphtheria, and pertussis (Tdap) vaccine  You may need a Td booster every 10 years. Varicella (chickenpox) vaccine  You may need this if you have not been vaccinated. Human papillomavirus (HPV) vaccine  If recommended by your health care provider, you may need three  doses over 6 months. Measles, mumps, and rubella (MMR) vaccine  You may need at least one dose of MMR. You may also need a second dose. Meningococcal conjugate (MenACWY) vaccine  One dose is recommended if you are age 46-21 years and a first-year college student living in a residence hall, or if you have one of several medical conditions. You may also need additional booster doses. Pneumococcal conjugate (PCV13) vaccine  You may need this if you have certain conditions and were not previously vaccinated. Pneumococcal polysaccharide (PPSV23) vaccine  You may need one or two doses if you smoke cigarettes or if you have certain conditions. Hepatitis A vaccine  You may need this if you have certain conditions or if you travel or work in places where you may be exposed to hepatitis A. Hepatitis B vaccine  You may need this if you have certain conditions or if you travel or work in places where you may be exposed to hepatitis B. Haemophilus influenzae type b (Hib) vaccine  You may need this if you have certain conditions. You may receive vaccines as individual doses or as more than one vaccine together in one shot (combination vaccines). Talk with your health care provider about the risks and benefits of combination vaccines. What tests do I need?  Blood tests  Lipid and cholesterol levels. These may be checked every 5 years starting at age 12.  Hepatitis C test.  Hepatitis B test. Screening  Diabetes screening. This is done by checking your blood sugar (glucose) after you have not eaten for a while (fasting).  Sexually transmitted disease (STD) testing.  BRCA-related cancer screening. This may be done if you have a family history of breast, ovarian, tubal, or peritoneal cancers.  Pelvic exam and Pap test. This may be done every 3 years starting at age 28. Starting at age 37, this may be done every 5 years if you have a Pap test in combination with an HPV test. Talk with your  health care provider about your test results, treatment options, and if necessary, the need for more tests. Follow these instructions at home: Eating and drinking   Eat a diet that includes fresh fruits and vegetables, whole grains, lean protein, and low-fat dairy.  Take vitamin and mineral supplements as recommended by your health care provider.  Do not drink alcohol if: ? Your health care provider tells you not to drink. ? You are pregnant, may be pregnant, or are planning to become pregnant.  If you drink alcohol: ? Limit how much you have to 0-1 drink a day. ? Be aware of how much alcohol is in your drink. In the [place] one drink equals one 12 oz bottle of beer (  355 mL), one 5 oz glass of wine (148 mL), or one 1 oz glass of hard liquor (44 mL). Lifestyle  Take daily care of your teeth and gums.  Stay active. Exercise for at least 30 minutes on 5 or more days each week.  Do not use any products that contain nicotine or tobacco, such as cigarettes, e-cigarettes, and chewing tobacco. If you need help quitting, ask your health care provider.  If you are sexually active, practice safe sex. Use a condom or other form of birth control (contraception) in order to prevent pregnancy and STIs (sexually transmitted infections). If you plan to become pregnant, see your health care provider for a preconception visit. What's next?  Visit your health care provider once a year for a well check visit.  Ask your health care provider how often you should have your eyes and teeth checked.  Stay up to date on all vaccines. This information is not intended to replace advice given to you by your health care provider. Make sure you discuss any questions you have with your health care provider. Document Revised: 07/08/2018 Document Reviewed: 07/08/2018 Elsevier Patient Education  2020 Elsevier Inc[employer]  

## 2020-03-26 NOTE — Progress Notes (Signed)
New Patient Office Visit  Subjective:  Patient ID: Jamie Shaw, female    DOB: 1990-11-16  Age: 29 y.o. MRN: [id number]  CC:  Chief Complaint  Patient presents with  . Establish Care    New patient, discuss medications.     HPI Jamie Shaw presents for establish with a primary care doctor.  She has no complaints or concerns.  2-year history of seizure disorder.  She is not certain about what caused the onset of her seizures.  She lives with her significant other.  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  She does not drink alcohol or smoke.  She does not smoke marijuana on occasion.  G0, P0.  She has not had a Pap smear.  She is using barrier contraception.  She has not seen a dentist lately.  She admits to feeling sad since childhood.  She has thought that she might be better off dead at times and has though about actually hurting herself, but not in a long time she tells me.   Past Medical History:  Diagnosis Date  . Seizures (HCC)     History reviewed. No pertinent surgical history.  Family History  Problem Relation Age of Onset  . Healthy Mother   . Healthy Father     Social History   Socioeconomic History  . Marital status: Single    Spouse name: Not on file  . Number of children: Not on file  . Years of education: Not on file  . Highest education level: Not on file  Occupational History  . Not on file  Tobacco Use  . Smoking status: Never Smoker  . Smokeless tobacco: Never Used  Substance and Sexual Activity  . Alcohol use: No  . Drug use: Yes    Types: Marijuana  . Sexual activity: Yes    Birth control/protection: None  Other Topics Concern  . Not on file  Social History Narrative  . Not on file   Social Determinants of Health   Financial Resource Strain:   . Difficulty of Paying Living Expenses:   Food Insecurity:   . Worried About Programme researcher, broadcasting/film/video in the Last Year:   . Barista in the Last Year:   Transportation Needs:   . Automotive engineer (Medical):   Marland Kitchen Lack of Transportation (Non-Medical):   Physical Activity:   . Days of Exercise per Week:   . Minutes of Exercise per Session:   Stress:   . Feeling of Stress :   Social Connections:   . Frequency of Communication with Friends and Family:   . Frequency of Social Gatherings with Friends and Family:   . Attends Religious Services:   . Active Member of Clubs or Organizations:   . Attends Banker Meetings:   Marland Kitchen Marital Status:   Intimate Partner Violence:   . Fear of Current or Ex-Partner:   . Emotionally Abused:   Marland Kitchen Physically Abused:   . Sexually Abused:     ROS Review of Systems  Constitutional: Negative.   HENT: Negative.   Eyes: Negative for photophobia and visual disturbance.  Respiratory: Negative.   Cardiovascular: Negative.   Gastrointestinal: Negative.   Endocrine: Negative for polyphagia and polyuria.  Genitourinary: Negative.   Musculoskeletal: Negative for gait problem and joint swelling.  Skin: Negative for pallor and rash.  Allergic/Immunologic: Negative for immunocompromised state.  Neurological: Negative for light-headedness and numbness.  Hematological: Does not bruise/bleed easily.  Depression screen [facility] 2/9 03/26/2020 03/26/2020  Decreased Interest 0 0  Down, Depressed, Hopeless 1 0  PHQ - 2 Score 1 0  Altered sleeping 2 -  Tired, decreased energy 2 -  Change in appetite 0 -  Feeling bad or failure about yourself  0 -  Trouble concentrating 0 -  Moving slowly or fidgety/restless 0 -  Suicidal thoughts 1 -  PHQ-9 Score 6 -  Difficult doing work/chores Somewhat difficult -     Objective:   Today's Vitals: BP 100/64   Pulse 78   Temp 97.9 F (36.6 C) (Tympanic)   Ht 5\' 2"  (1.575 m)   Wt 110 lb 9.6 oz (50.2 kg)   SpO2 99%   BMI 20.23 kg/m   Physical Exam Constitutional:      General: She is not in acute distress.    Appearance: Normal appearance. She is normal weight. She is not ill-appearing,  toxic-appearing or diaphoretic.  HENT:     Head: Normocephalic and atraumatic.     Right Ear: Tympanic membrane, ear canal and external ear normal. There is no impacted cerumen.     Left Ear: Tympanic membrane, ear canal and external ear normal. There is no impacted cerumen.     Nose: No congestion or rhinorrhea.     Mouth/Throat:     Mouth: Mucous membranes are moist.     Pharynx: Oropharynx is clear.   Eyes:     General: No scleral icterus.       Right eye: No discharge.        Left eye: No discharge.     Extraocular Movements: Extraocular movements intact.     Conjunctiva/sclera: Conjunctivae normal.     Pupils: Pupils are equal, round, and reactive to light.  Cardiovascular:     Rate and Rhythm: Normal rate and regular rhythm.  Pulmonary:     Effort: Pulmonary effort is normal.     Breath sounds: Normal breath sounds.  Abdominal:     General: Abdomen is flat. Bowel sounds are normal. There is no distension.     Palpations: Abdomen is soft. There is no mass.     Tenderness: There is no abdominal tenderness. There is no guarding or rebound.     Hernia: No hernia is present.  Musculoskeletal:     Cervical back: No rigidity.     Right lower leg: No edema.     Left lower leg: No edema.  Lymphadenopathy:     Cervical: No cervical adenopathy.  Skin:    General: Skin is warm and dry.     Coloration: Skin is not jaundiced.  Neurological:     Mental Status: She is alert and oriented to person, place, and time.  Psychiatric:        Mood and Affect: Mood normal.        Behavior: Behavior normal.     Assessment & Plan:   Problem List Items Addressed This Visit      Nervous and Auditory   Seizure disorder (HCC)   Relevant Medications   levETIRAcetam (KEPPRA) 750 MG tablet     Musculoskeletal and Integument   TMJ disease     Other   Healthcare maintenance - Primary   Relevant Orders   CBC   Comprehensive metabolic panel   Lipid panel   HIV Antibody (routine testing  w rflx)   Ambulatory referral to Obstetrics / Gynecology   Hemoglobin A1c   Urinalysis, Routine w reflex microscopic   Depression  with anxiety   Relevant Orders   Ambulatory referral to Psychology      Outpatient Encounter Medications as of 03/26/2020  Medication Sig  . levETIRAcetam (KEPPRA) 750 MG tablet Take 750 mg by mouth 2 (two) times daily.  . ondansetron (ZOFRAN ODT) 4 MG disintegrating tablet Take 1 tablet (4 mg total) by mouth every 8 (eight) hours as needed for nausea or vomiting.  . [DISCONTINUED] levETIRAcetam (KEPPRA) 500 MG tablet Take 500 mg by mouth 2 (two) times daily.   . [DISCONTINUED] levETIRAcetam (KEPPRA) 500 MG tablet Take 1 tablet (500 mg) in the morning and 2 tablets (1000 mg) in the evenings. (Patient not taking: Reported on 03/26/2020)   No facility-administered encounter medications on file as of 03/26/2020.    Follow-up: Return in about 3 months (around 06/26/2020), or if symptoms worsen or fail to improve, for Nice to meet you. Please see the dentist. .  To follow up for your TMJ. Agrees to go for talking therapy.   Mliss Sax, MD    Addendum: Hadn't seen the PHQ9 until after she went to the lab. She was seen back in the room and said that she had not thought about hurting herself in quite some time but did agree to go for counseling with my recommendation.

## 2020-03-27 LAB — COMPREHENSIVE METABOLIC PANEL
ALT: 26 U/L (ref 0–35)
AST: 22 U/L (ref 0–37)
Albumin: 4.7 g/dL (ref 3.5–5.2)
Alkaline Phosphatase: 40 U/L (ref 39–117)
BUN: 12 mg/dL (ref 6–23)
CO2: 29 mEq/L (ref 19–32)
Calcium: 9.4 mg/dL (ref 8.4–10.5)
Chloride: 105 mEq/L (ref 96–112)
Creatinine, Ser: 0.7 mg/dL (ref 0.40–1.20)
GFR: 100.75 mL/min (ref >60.00)
Glucose, Bld: 89 mg/dL (ref 70–99)
Potassium: 4.7 mEq/L (ref 3.5–5.1)
Sodium: 136 mEq/L (ref 135–145)
Total Bilirubin: 0.5 mg/dL (ref 0.2–1.2)
Total Protein: 7 g/dL (ref 6.0–8.3)

## 2020-03-27 LAB — LIPID PANEL
Cholesterol: 154 mg/dL (ref 0–200)
HDL: 61.5 mg/dL (ref >39.00)
LDL Cholesterol: 76 mg/dL (ref 0–99)
NonHDL: 92.86
Total CHOL/HDL Ratio: 3
Triglycerides: 83 mg/dL (ref 0.0–149.0)
VLDL: 16.6 mg/dL (ref 0.0–40.0)

## 2020-03-27 LAB — CBC
HCT: 40.6 % (ref 36.0–46.0)
Hemoglobin: 14 g/dL (ref 12.0–15.0)
MCHC: 34.4 g/dL (ref 30.0–36.0)
MCV: 107.3 fl — ABNORMAL HIGH (ref 78.0–100.0)
Platelets: 182 10*3/uL (ref 150.0–400.0)
RBC: 3.79 Mil/uL — ABNORMAL LOW (ref 3.87–5.11)
RDW: 12.6 % (ref 11.5–15.5)
WBC: 6.3 10*3/uL (ref 4.0–10.5)

## 2020-03-27 LAB — HEMOGLOBIN A1C: Hgb A1c MFr Bld: 5.4 % (ref 4.6–6.5)

## 2020-03-27 LAB — HIV ANTIBODY (ROUTINE TESTING W REFLEX): HIV 1&2 Ab, 4th Generation: NONREACTIVE

## 2020-03-29 ENCOUNTER — Telehealth: Payer: Self-pay | Admitting: Family Medicine

## 2020-03-29 NOTE — Telephone Encounter (Signed)
Spoke with patient regarding labs.

## 2020-03-29 NOTE — Telephone Encounter (Signed)
Patient is returning the call. CB is [phone]

## 2020-04-23 ENCOUNTER — Ambulatory Visit (INDEPENDENT_AMBULATORY_CARE_PROVIDER_SITE_OTHER): Payer: BC Managed Care – PPO | Admitting: Psychology

## 2020-04-23 DIAGNOSIS — F4312 Post-traumatic stress disorder, chronic: Secondary | ICD-10-CM

## 2020-05-07 ENCOUNTER — Ambulatory Visit (INDEPENDENT_AMBULATORY_CARE_PROVIDER_SITE_OTHER): Payer: BC Managed Care – PPO | Admitting: Psychology

## 2020-05-07 DIAGNOSIS — F4310 Post-traumatic stress disorder, unspecified: Secondary | ICD-10-CM

## 2020-05-28 ENCOUNTER — Ambulatory Visit (INDEPENDENT_AMBULATORY_CARE_PROVIDER_SITE_OTHER): Payer: BC Managed Care – PPO | Admitting: Psychology

## 2020-05-28 DIAGNOSIS — F4310 Post-traumatic stress disorder, unspecified: Secondary | ICD-10-CM

## 2020-06-18 ENCOUNTER — Ambulatory Visit (INDEPENDENT_AMBULATORY_CARE_PROVIDER_SITE_OTHER): Payer: BC Managed Care – PPO | Admitting: Psychology

## 2020-06-18 DIAGNOSIS — F4310 Post-traumatic stress disorder, unspecified: Secondary | ICD-10-CM | POA: Diagnosis not present

## 2020-07-09 ENCOUNTER — Ambulatory Visit (INDEPENDENT_AMBULATORY_CARE_PROVIDER_SITE_OTHER): Payer: BC Managed Care – PPO | Admitting: Psychology

## 2020-07-09 DIAGNOSIS — F4310 Post-traumatic stress disorder, unspecified: Secondary | ICD-10-CM | POA: Diagnosis not present

## 2020-07-30 ENCOUNTER — Ambulatory Visit (INDEPENDENT_AMBULATORY_CARE_PROVIDER_SITE_OTHER): Payer: BC Managed Care – PPO | Admitting: Psychology

## 2020-07-30 DIAGNOSIS — F4310 Post-traumatic stress disorder, unspecified: Secondary | ICD-10-CM | POA: Diagnosis not present

## 2020-08-13 ENCOUNTER — Ambulatory Visit (INDEPENDENT_AMBULATORY_CARE_PROVIDER_SITE_OTHER): Payer: BC Managed Care – PPO | Admitting: Psychology

## 2020-08-13 DIAGNOSIS — F4310 Post-traumatic stress disorder, unspecified: Secondary | ICD-10-CM

## 2020-08-27 ENCOUNTER — Ambulatory Visit (INDEPENDENT_AMBULATORY_CARE_PROVIDER_SITE_OTHER): Payer: BC Managed Care – PPO | Admitting: Psychology

## 2020-08-27 DIAGNOSIS — F4310 Post-traumatic stress disorder, unspecified: Secondary | ICD-10-CM | POA: Diagnosis not present

## 2020-09-10 ENCOUNTER — Ambulatory Visit (INDEPENDENT_AMBULATORY_CARE_PROVIDER_SITE_OTHER): Payer: BC Managed Care – PPO | Admitting: Psychology

## 2020-09-10 DIAGNOSIS — F4310 Post-traumatic stress disorder, unspecified: Secondary | ICD-10-CM | POA: Diagnosis not present

## 2020-09-11 ENCOUNTER — Telehealth (INDEPENDENT_AMBULATORY_CARE_PROVIDER_SITE_OTHER): Payer: BC Managed Care – PPO | Admitting: Family Medicine

## 2020-09-11 ENCOUNTER — Encounter: Payer: Self-pay | Admitting: Family Medicine

## 2020-09-11 VITALS — Ht 62.0 in | Wt 112.0 lb

## 2020-09-11 DIAGNOSIS — Z0000 Encounter for general adult medical examination without abnormal findings: Secondary | ICD-10-CM | POA: Diagnosis not present

## 2020-09-11 DIAGNOSIS — F418 Other specified anxiety disorders: Secondary | ICD-10-CM | POA: Diagnosis not present

## 2020-09-11 DIAGNOSIS — G40909 Epilepsy, unspecified, not intractable, without status epilepticus: Secondary | ICD-10-CM | POA: Diagnosis not present

## 2020-09-11 DIAGNOSIS — F959 Tic disorder, unspecified: Secondary | ICD-10-CM

## 2020-09-11 MED ORDER — LEVETIRACETAM 750 MG PO TABS: 750.0000 mg | ORAL_TABLET | Freq: Two times a day (BID) | ORAL | 2 refills | Status: DC

## 2020-09-11 NOTE — Progress Notes (Signed)
Established Patient Office Visit  Subjective:  Patient ID: Jamie Shaw, female    DOB: 01/24/1991  Age: 29 y.o. MRN: [id number]  CC:  Chief Complaint  Patient presents with  . Medication Refill    refill on keppra personal question.     HPI Jamie Shaw presents for follow-up of her seizure disorder.  Current neurologist in [facility] retired she does not have 1.  Keppra is working well for her at 750 mg twice daily.  She has had no further seizures.  Tolerating the medicine well.  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  She is seeing a therapist for her depression and feels like she is making headway there.  Both she and her therapist do not believe she needs a medication at this point in time.  She tells me about what sounds like an interesting Tic.  She has done this since she was a little girl.  When she is walking and turns 1 way after she has walked for a while she has to turn the other way to compensate.  She denies any headaches lightheadedness or spinning sensation.  It is just a phenomenon that she has experienced over the years.  She has had seizures for 2 years.  She denies any associated trauma with the onset of her seizures.  She denies being sick before her seizure started.  Past Medical History:  Diagnosis Date  . Seizures (HCC)     History reviewed. No pertinent surgical history.  Family History  Problem Relation Age of Onset  . Healthy Mother   . Healthy Father     Social History   Socioeconomic History  . Marital status: Single    Spouse name: Not on file  . Number of children: Not on file  . Years of education: Not on file  . Highest education level: Not on file  Occupational History  . Not on file  Tobacco Use  . Smoking status: Never Smoker  . Smokeless tobacco: Never Used  Vaping Use  . Vaping Use: Never used  Substance and Sexual Activity  . Alcohol use: No  . Drug use: Yes    Types: Marijuana  .  Sexual activity: Yes    Birth control/protection: None  Other Topics Concern  . Not on file  Social History Narrative  . Not on file   Social Determinants of Health   Financial Resource Strain:   . Difficulty of Paying Living Expenses: Not on file  Food Insecurity:   . Worried About Programme researcher, broadcasting/film/video in the Last Year: Not on file  . Ran Out of Food in the Last Year: Not on file  Transportation Needs:   . Lack of Transportation (Medical): Not on file  . Lack of Transportation (Non-Medical): Not on file  Physical Activity:   . Days of Exercise per Week: Not on file  . Minutes of Exercise per Session: Not on file  Stress:   . Feeling of Stress : Not on file  Social Connections:   . Frequency of Communication with Friends and Family: Not on file  . Frequency of Social Gatherings with Friends and Family: Not on file  . Attends Religious Services: Not on file  . Active Member of Clubs or Organizations: Not on file  . Attends Banker Meetings: Not on file  . Marital Status: Not on file  Intimate Partner Violence:   . Fear  of Current or Ex-Partner: Not on file  . Emotionally Abused: Not on file  . Physically Abused: Not on file  . Sexually Abused: Not on file    Outpatient Medications Prior to Visit  Medication Sig Dispense Refill  . levETIRAcetam (KEPPRA) 750 MG tablet Take 750 mg by mouth 2 (two) times daily.    . ondansetron (ZOFRAN ODT) 4 MG disintegrating tablet Take 1 tablet (4 mg total) by mouth every 8 (eight) hours as needed for nausea or vomiting. (Patient not taking: Reported on 09/11/2020) 20 tablet 0   No facility-administered medications prior to visit.    No Known Allergies  ROS Review of Systems  Constitutional: Negative.   HENT: Negative.   Eyes: Negative for photophobia and visual disturbance.  Respiratory: Negative.   Cardiovascular: Negative.   Gastrointestinal: Negative.   Endocrine: Negative for polyphagia and polyuria.   Musculoskeletal: Negative.   Skin: Negative.   Allergic/Immunologic: Negative for immunocompromised state.  Neurological: Positive for seizures. Negative for dizziness, weakness, light-headedness, numbness and headaches.  Hematological: Negative.   Psychiatric/Behavioral: Positive for dysphoric mood.      Objective:    Physical Exam  Ht 5\' 2"  (1.575 m)   Wt 112 lb (50.8 kg)   BMI 20.49 kg/m  Wt Readings from Last 3 Encounters:  09/11/20 112 lb (50.8 kg)  03/26/20 110 lb 9.6 oz (50.2 kg)  12/24/15 152 lb (68.9 kg)     Health Maintenance Due  Topic Date Due  . Hepatitis C Screening  Never done  . PAP-Cervical Cytology Screening  Never done  . PAP SMEAR-Modifier  Never done  . INFLUENZA VACCINE  Never done    There are no preventive care reminders to display for this patient.  No results found for: TSH Lab Results  Component Value Date   WBC 6.3 03/26/2020   HGB 14.0 03/26/2020   HCT 40.6 03/26/2020   MCV 107.3 (H) 03/26/2020   PLT 182.0 03/26/2020   Lab Results  Component Value Date   NA 136 03/26/2020   K 4.7 03/26/2020   CO2 29 03/26/2020   GLUCOSE 89 03/26/2020   BUN 12 03/26/2020   CREATININE 0.70 03/26/2020   BILITOT 0.5 03/26/2020   ALKPHOS 40 03/26/2020   AST 22 03/26/2020   ALT 26 03/26/2020   PROT 7.0 03/26/2020   ALBUMIN 4.7 03/26/2020   CALCIUM 9.4 03/26/2020   ANIONGAP 8 06/18/2018   GFR 100.75 03/26/2020   Lab Results  Component Value Date   CHOL 154 03/26/2020   Lab Results  Component Value Date   HDL 61.50 03/26/2020   Lab Results  Component Value Date   LDLCALC 76 03/26/2020   Lab Results  Component Value Date   TRIG 83.0 03/26/2020   Lab Results  Component Value Date   CHOLHDL 3 03/26/2020   Lab Results  Component Value Date   HGBA1C 5.4 03/26/2020      Assessment & Plan:   Problem List Items Addressed This Visit      Nervous and Auditory   Seizure disorder (HCC)   Relevant Medications   levETIRAcetam  (KEPPRA) 750 MG tablet   Other Relevant Orders   Ambulatory referral to Neurology     Other   Healthcare maintenance   Relevant Orders   Ambulatory referral to Ophthalmology   Ambulatory referral to Obstetrics / Gynecology   Depression with anxiety   Tic like phenomenon - Primary   Relevant Orders   Ambulatory referral to Neurology  Meds ordered this encounter  Medications  . levETIRAcetam (KEPPRA) 750 MG tablet    Sig: Take 1 tablet (750 mg total) by mouth 2 (two) times daily.    Dispense:  180 tablet    Refill:  2    Follow-up: Return in about 6 months (around 03/11/2021).    Mliss Sax, MD   Virtual Visit via Video Note  I connected with Veronika Heard Mcfall on 09/11/20 at  1:00 PM EDT by a video enabled telemedicine application and verified that I am speaking with the correct person using two identifiers.  Location: Patient: work alone.  Provider: home   I discussed the limitations of evaluation and management by telemedicine and the availability of in person appointments. The patient expressed understanding and agreed to proceed.  History of Present Illness:    Observations/Objective:   Assessment and Plan:   Follow Up Instructions:    I discussed the assessment and treatment plan with the patient. The patient was provided an opportunity to ask questions and all were answered. The patient agreed with the plan and demonstrated an understanding of the instructions.   The patient was advised to call back or seek an in-person evaluation if the symptoms worsen or if the condition fails to improve as anticipated.  I provided 25 minutes of non-face-to-face time during this encounter.   Mliss Sax, MD

## 2020-09-19 ENCOUNTER — Telehealth: Payer: Self-pay

## 2020-09-19 NOTE — Telephone Encounter (Signed)
Ariana calling from [facility] in regards to pt.  Jamie Shaw stated that pt is established with [facility].  She would like a call back to find out if her referral should be sent to [facility] instead. Please advise. CB# [phone]

## 2020-09-21 NOTE — Telephone Encounter (Signed)
LM for pt to see if she wants to go to [facility] or is she would like to be re-est at [facility]

## 2020-09-21 NOTE — Telephone Encounter (Signed)
Called pt back. She does want to go to [facility]. Her former doctor at [facility] retired and the next one assigned is moving/relocating. Msg sent via referral to [facility] with [facility].

## 2020-09-21 NOTE — Telephone Encounter (Signed)
Pt returning a call.  Please advise CB# [phone].

## 2020-10-01 ENCOUNTER — Ambulatory Visit (INDEPENDENT_AMBULATORY_CARE_PROVIDER_SITE_OTHER): Payer: BC Managed Care – PPO | Admitting: Psychology

## 2020-10-01 DIAGNOSIS — F4310 Post-traumatic stress disorder, unspecified: Secondary | ICD-10-CM | POA: Diagnosis not present

## 2020-10-15 ENCOUNTER — Ambulatory Visit (INDEPENDENT_AMBULATORY_CARE_PROVIDER_SITE_OTHER): Payer: BC Managed Care – PPO | Admitting: Psychology

## 2020-10-15 DIAGNOSIS — F4310 Post-traumatic stress disorder, unspecified: Secondary | ICD-10-CM

## 2020-10-29 ENCOUNTER — Ambulatory Visit: Payer: BC Managed Care – PPO | Admitting: Psychology

## 2020-11-07 DIAGNOSIS — H0288A Meibomian gland dysfunction right eye, upper and lower eyelids: Secondary | ICD-10-CM | POA: Diagnosis not present

## 2020-11-07 DIAGNOSIS — H0288B Meibomian gland dysfunction left eye, upper and lower eyelids: Secondary | ICD-10-CM | POA: Diagnosis not present

## 2020-11-08 ENCOUNTER — Ambulatory Visit (INDEPENDENT_AMBULATORY_CARE_PROVIDER_SITE_OTHER): Payer: BC Managed Care – PPO | Admitting: Psychology

## 2020-11-08 DIAGNOSIS — F4310 Post-traumatic stress disorder, unspecified: Secondary | ICD-10-CM

## 2020-11-26 ENCOUNTER — Ambulatory Visit (INDEPENDENT_AMBULATORY_CARE_PROVIDER_SITE_OTHER): Payer: BC Managed Care – PPO | Admitting: Psychology

## 2020-11-26 DIAGNOSIS — F4310 Post-traumatic stress disorder, unspecified: Secondary | ICD-10-CM

## 2020-12-06 ENCOUNTER — Other Ambulatory Visit: Payer: Self-pay

## 2020-12-06 ENCOUNTER — Ambulatory Visit: Payer: BC Managed Care – PPO | Admitting: Neurology

## 2020-12-06 ENCOUNTER — Encounter: Payer: Self-pay | Admitting: Neurology

## 2020-12-06 VITALS — BP 108/60 | HR 72 | Ht 62.0 in | Wt 122.5 lb

## 2020-12-06 DIAGNOSIS — E538 Deficiency of other specified B group vitamins: Secondary | ICD-10-CM

## 2020-12-06 DIAGNOSIS — G40909 Epilepsy, unspecified, not intractable, without status epilepticus: Secondary | ICD-10-CM

## 2020-12-06 MED ORDER — LAMOTRIGINE 100 MG PO TABS: 100.0000 mg | ORAL_TABLET | Freq: Two times a day (BID) | ORAL | 11 refills | Status: DC

## 2020-12-06 MED ORDER — LAMOTRIGINE 25 MG PO TABS: ORAL_TABLET | ORAL | 0 refills | Status: DC
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
## 2020-12-07 LAB — CBC WITH DIFFERENTIAL
Basophils Absolute: 0 10*3/uL (ref 0.0–0.2)
Basos: 0 %
EOS (ABSOLUTE): 0 10*3/uL (ref 0.0–0.4)
Eos: 1 %
Hematocrit: 44.8 % (ref 34.0–46.6)
Hemoglobin: 15 g/dL (ref 11.1–15.9)
Immature Grans (Abs): 0 10*3/uL (ref 0.0–0.1)
Immature Granulocytes: 0 %
Lymphocytes Absolute: 1.4 10*3/uL (ref 0.7–3.1)
Lymphs: 18 %
MCH: 35 pg — ABNORMAL HIGH (ref 26.6–33.0)
MCHC: 33.5 g/dL (ref 31.5–35.7)
MCV: 104 fL — ABNORMAL HIGH (ref 79–97)
Monocytes Absolute: 0.2 10*3/uL (ref 0.1–0.9)
Monocytes: 3 %
Neutrophils Absolute: 6.4 10*3/uL (ref 1.4–7.0)
Neutrophils: 78 %
RBC: 4.29 x10E6/uL (ref 3.77–5.28)
RDW: 11.6 % — ABNORMAL LOW (ref 11.7–15.4)
WBC: 8.2 10*3/uL (ref 3.4–10.8)

## 2020-12-07 LAB — COMPREHENSIVE METABOLIC PANEL
ALT: 32 IU/L (ref 0–32)
AST: 24 IU/L (ref 0–40)
Albumin/Globulin Ratio: 1.9 (ref 1.2–2.2)
Albumin: 5 g/dL (ref 3.9–5.0)
Alkaline Phosphatase: 47 IU/L (ref 44–121)
BUN/Creatinine Ratio: 14 (ref 9–23)
BUN: 11 mg/dL (ref 6–20)
Bilirubin Total: 0.5 mg/dL (ref 0.0–1.2)
CO2: 23 mmol/L (ref 20–29)
Calcium: 10.2 mg/dL (ref 8.7–10.2)
Chloride: 103 mmol/L (ref 96–106)
Creatinine, Ser: 0.79 mg/dL (ref 0.57–1.00)
GFR calc Af Amer: 119 mL/min/{1.73_m2} (ref >59)
GFR calc non Af Amer: 103 mL/min/{1.73_m2} (ref >59)
Globulin, Total: 2.6 g/dL (ref 1.5–4.5)
Glucose: 89 mg/dL (ref 65–99)
Potassium: 4.5 mmol/L (ref 3.5–5.2)
Sodium: 141 mmol/L (ref 134–144)
Total Protein: 7.6 g/dL (ref 6.0–8.5)

## 2020-12-07 LAB — VITAMIN B12: Vitamin B-12: 910 pg/mL (ref 232–1245)

## 2020-12-07 LAB — TSH: TSH: 1.46 u[IU]/mL (ref 0.450–4.500)

## 2020-12-10 ENCOUNTER — Telehealth: Payer: Self-pay | Admitting: Neurology

## 2020-12-10 ENCOUNTER — Ambulatory Visit (INDEPENDENT_AMBULATORY_CARE_PROVIDER_SITE_OTHER): Payer: BC Managed Care – PPO | Admitting: Psychology

## 2020-12-10 DIAGNOSIS — F4310 Post-traumatic stress disorder, unspecified: Secondary | ICD-10-CM

## 2020-12-10 NOTE — Telephone Encounter (Signed)
error 

## 2020-12-12 ENCOUNTER — Other Ambulatory Visit: Payer: Self-pay

## 2020-12-12 ENCOUNTER — Ambulatory Visit: Payer: BC Managed Care – PPO

## 2020-12-12 DIAGNOSIS — G40909 Epilepsy, unspecified, not intractable, without status epilepticus: Secondary | ICD-10-CM

## 2020-12-24 ENCOUNTER — Ambulatory Visit (INDEPENDENT_AMBULATORY_CARE_PROVIDER_SITE_OTHER): Payer: BC Managed Care – PPO | Admitting: Psychology

## 2020-12-24 DIAGNOSIS — F4310 Post-traumatic stress disorder, unspecified: Secondary | ICD-10-CM | POA: Diagnosis not present

## 2020-12-24 NOTE — Procedures (Signed)
   HISTORY: 30 year old female, with history of seizure  TECHNIQUE:  This is a routine 16 channel EEG recording with one channel devoted to a limited EKG recording.  It was performed during wakefulness, drowsiness and asleep.  Hyperventilation and photic stimulation were performed as activating procedures.  There are minimum muscle and movement artifact noted.  Upon maximum arousal, posterior dominant waking rhythm consistent of rhythmic alpha range activity, with frequency of 10 hz. Activities are symmetric over the bilateral posterior derivations and attenuated with eye opening.  Hyperventilation produced mild/moderate buildup with higher amplitude and the slower activities noted.  Photic stimulation did not alter the tracing.  During EEG recording, patient developed drowsiness and no deeper stage of sleep was achieved  During EEG recording, there was no epileptiform discharge noted.  EKG demonstrate sinus rhythm, with heart rate of 92 beats a minute  CONCLUSION: This is a  normal awake EEG.  There is no electrodiagnostic evidence of epileptiform discharge.  Levert Feinstein, M.D. Ph.D.  [facility] [address] [place], [place] [address] Phone: [phone] Fax:      [phone]

## 2020-12-27 ENCOUNTER — Telehealth: Payer: Self-pay | Admitting: Neurology

## 2020-12-27 NOTE — Telephone Encounter (Signed)
I called pt and we discussed message.  Patient reports she is currently taking Lamotrigine 25 mg 1 tablet in the morning 1 tablet at lunch and 1 tablet at bedtime.  Patient wanted to know what her dosage should be coming up on week 4? Dr. Terrace Arabia was out of the office when I called the patient.  I went to work in physician Dr. Anne Hahn and received verbal orders for the patient to start 2 tablets twice a day.  He states that the patient can start this today or tomorrow.  I have relayed instructions to patient and she verbalized understanding. Patient understands she is to take 2 tablets twice a day a total of 4 tablets daily.  Patient was advised to give [place] a call next week if she had any concerns, and we could talk with Dr. Terrace Arabia about week 5 dosage.  Patient verbalized understanding and had no further questions.

## 2020-12-27 NOTE — Telephone Encounter (Signed)
Pt unsure as to how to taker her medication lamoTRIgine (LAMICTAL) 25 MG tablet on week 4, please call

## 2020-12-31 ENCOUNTER — Telehealth: Payer: Self-pay | Admitting: Neurology

## 2020-12-31 NOTE — Telephone Encounter (Signed)
I called the patient back to let her know there is a lamotrigine 100mg , one tab BID presciption on file at the pharmacy. She should start this dosage with she completes her titration with the 25mg  tablets. She verbalized understanding.

## 2020-12-31 NOTE — Telephone Encounter (Signed)
Pt. is asking about her 5th week medication. Please advise.

## 2021-01-07 ENCOUNTER — Ambulatory Visit (INDEPENDENT_AMBULATORY_CARE_PROVIDER_SITE_OTHER): Payer: BC Managed Care – PPO | Admitting: Psychology

## 2021-01-07 DIAGNOSIS — F4310 Post-traumatic stress disorder, unspecified: Secondary | ICD-10-CM

## 2021-01-21 ENCOUNTER — Ambulatory Visit (INDEPENDENT_AMBULATORY_CARE_PROVIDER_SITE_OTHER): Payer: BC Managed Care – PPO | Admitting: Psychology

## 2021-01-21 DIAGNOSIS — F4310 Post-traumatic stress disorder, unspecified: Secondary | ICD-10-CM

## 2021-02-11 ENCOUNTER — Ambulatory Visit (INDEPENDENT_AMBULATORY_CARE_PROVIDER_SITE_OTHER): Payer: BC Managed Care – PPO | Admitting: Psychology

## 2021-02-11 DIAGNOSIS — F4310 Post-traumatic stress disorder, unspecified: Secondary | ICD-10-CM

## 2021-03-11 ENCOUNTER — Ambulatory Visit (INDEPENDENT_AMBULATORY_CARE_PROVIDER_SITE_OTHER): Payer: BC Managed Care – PPO | Admitting: Psychology

## 2021-03-11 DIAGNOSIS — F4310 Post-traumatic stress disorder, unspecified: Secondary | ICD-10-CM | POA: Diagnosis not present

## 2021-04-01 ENCOUNTER — Ambulatory Visit (INDEPENDENT_AMBULATORY_CARE_PROVIDER_SITE_OTHER): Payer: BC Managed Care – PPO | Admitting: Psychology

## 2021-04-01 DIAGNOSIS — F4310 Post-traumatic stress disorder, unspecified: Secondary | ICD-10-CM

## 2021-04-29 ENCOUNTER — Ambulatory Visit (INDEPENDENT_AMBULATORY_CARE_PROVIDER_SITE_OTHER): Payer: BC Managed Care – PPO | Admitting: Psychology

## 2021-04-29 DIAGNOSIS — F4310 Post-traumatic stress disorder, unspecified: Secondary | ICD-10-CM

## 2021-04-29 DIAGNOSIS — F411 Generalized anxiety disorder: Secondary | ICD-10-CM

## 2021-05-27 ENCOUNTER — Ambulatory Visit (INDEPENDENT_AMBULATORY_CARE_PROVIDER_SITE_OTHER): Payer: BC Managed Care – PPO | Admitting: Psychology

## 2021-05-27 DIAGNOSIS — F411 Generalized anxiety disorder: Secondary | ICD-10-CM

## 2021-05-27 DIAGNOSIS — F4310 Post-traumatic stress disorder, unspecified: Secondary | ICD-10-CM | POA: Diagnosis not present

## 2021-06-05 ENCOUNTER — Telehealth: Payer: Self-pay | Admitting: Neurology

## 2021-06-05 ENCOUNTER — Ambulatory Visit: Payer: BC Managed Care – PPO | Admitting: Neurology

## 2021-06-05 NOTE — Telephone Encounter (Signed)
7/27 OV cancelled due to [name] out of office, LVM and sent pt mychart msg.

## 2021-06-10 ENCOUNTER — Other Ambulatory Visit: Payer: Self-pay

## 2021-06-10 ENCOUNTER — Telehealth: Payer: Self-pay | Admitting: Family Medicine

## 2021-06-10 ENCOUNTER — Ambulatory Visit (INDEPENDENT_AMBULATORY_CARE_PROVIDER_SITE_OTHER): Payer: BC Managed Care – PPO | Admitting: Psychology

## 2021-06-10 DIAGNOSIS — F411 Generalized anxiety disorder: Secondary | ICD-10-CM

## 2021-06-10 DIAGNOSIS — F4310 Post-traumatic stress disorder, unspecified: Secondary | ICD-10-CM
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
## 2021-06-11 ENCOUNTER — Encounter: Payer: Self-pay | Admitting: Family Medicine

## 2021-06-11 ENCOUNTER — Ambulatory Visit: Payer: BC Managed Care – PPO | Admitting: Family Medicine

## 2021-06-11 VITALS — BP 118/64 | HR 83 | Temp 97.6°F | Ht 62.0 in | Wt 130.8 lb

## 2021-06-11 DIAGNOSIS — G44209 Tension-type headache, unspecified, not intractable: Secondary | ICD-10-CM | POA: Diagnosis not present

## 2021-06-11 DIAGNOSIS — F419 Anxiety disorder, unspecified: Secondary | ICD-10-CM

## 2021-06-11 DIAGNOSIS — G4452 New daily persistent headache (NDPH): Secondary | ICD-10-CM

## 2021-06-11 MED ORDER — PREDNISONE 20 MG PO TABS: 20.0000 mg | ORAL_TABLET | Freq: Two times a day (BID) | ORAL | 0 refills | Status: AC

## 2021-06-11 NOTE — Progress Notes (Signed)
Established Patient Office Visit  Subjective:  Patient ID: Jamie Shaw, female    DOB: Jul 30, 1991  Age: 30 y.o. MRN: [id number]  CC:  Chief Complaint  Patient presents with   Headache    Headache x 4 days, per patient a table fell on her head about 2 months ago.     HPI Jamie Shaw presents for a 4-day history of intermittent headaches across the top of her head and around her head.  She has a history of migraines but these are different.  They come and go on their own.  They have been mild to severe.  The headache has been so severe that it is stopped her from doing anything but lying down.  She has tried Advil without relief.  Family history of brain tumor.  Normal MRI of brain 7 years ago.  She denies prodromal symptoms of nausea or vomiting.  She is treating them with Advil.  Denies nasal congestion postnasal drip facial pressure sneezing or rhinorrhea.  No cough.  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  She is stressed.  She worries about people stealing from the business.  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  Denies difficulty swallowing or unilateral weakness or paresthesias.  Table and falling on her head 2 months ago but she recovered from that without issue.  Past Medical History:  Diagnosis Date   Seizures [facility])     Past Surgical History:  Procedure Laterality Date   no past surgery      Family History  Problem Relation Age of Onset   Healthy Mother    Healthy Father     Social History   Socioeconomic History   Marital status: Single    Spouse name: Not on file   Number of children: 0   Years of education: college   Highest education level: Bachelor's degree (e.g., BA, AB, BS)  Occupational History   Occupation: cashier/stocks at [employer]  Tobacco Use   Smoking status: Never   Smokeless tobacco: Never  Vaping Use   Vaping Use: Never used  Substance and Sexual Activity   Alcohol use: No   Drug use: Yes    Types: Marijuana    Comment:  smokes daily   Sexual activity: Yes    Birth control/protection: None  Other Topics Concern   Not on file  Social History Narrative   Lives at home with significant other.   Caffeine use: 2 cups per day.   Right-handed.   Social Determinants of Health   Financial Resource Strain: Not on file  Food Insecurity: Not on file  Transportation Needs: Not on file  Physical Activity: Not on file  Stress: Not on file  Social Connections: Not on file  Intimate Partner Violence: Not on file    Outpatient Medications Prior to Visit  Medication Sig Dispense Refill   lamoTRIgine (LAMICTAL) 100 MG tablet Take 1 tablet (100 mg total) by mouth 2 (two) times daily. 60 tablet 11   lamoTRIgine (LAMICTAL) 25 MG tablet 1 tab bid x one week 2 tab bid x 2nd week 3 tab bid x 3rd week (Patient not taking: Reported on 06/11/2021) 84 tablet 0   levETIRAcetam (KEPPRA) 750 MG tablet Take 1 tablet (750 mg total) by mouth 2 (two) times daily. (Patient not taking: Reported on 06/11/2021) 180 tablet 2   No facility-administered medications prior to visit.    No Known Allergies  ROS Review of Systems  Constitutional: Negative.   HENT:  Negative for congestion, postnasal drip, rhinorrhea, sinus pressure, sinus pain and sneezing.   Eyes:  Negative for photophobia and visual disturbance.  Respiratory: Negative.    Cardiovascular: Negative.   Gastrointestinal: Negative.   Neurological:  Positive for seizures and headaches. Negative for dizziness, facial asymmetry, speech difficulty, weakness and numbness.  Psychiatric/Behavioral:  The patient is nervous/anxious.      Objective:    Physical Exam Vitals and nursing note reviewed.  Constitutional:      General: She is not in acute distress.    Appearance: She is well-developed and normal weight. She is not ill-appearing, toxic-appearing or diaphoretic.  HENT:     Head: Normocephalic and atraumatic.     Mouth/Throat:     Mouth: Mucous membranes are moist.      Pharynx: Oropharynx is clear.  Eyes:     General: No visual field deficit.    Extraocular Movements: Extraocular movements intact.     Pupils: Pupils are equal, round, and reactive to light.  Cardiovascular:     Rate and Rhythm: Normal rate and regular rhythm.  Pulmonary:     Effort: Pulmonary effort is normal.     Breath sounds: Normal breath sounds.  Musculoskeletal:     Cervical back: No rigidity.  Lymphadenopathy:     Cervical: No cervical adenopathy.  Neurological:     Mental Status: She is alert.     Cranial Nerves: No cranial nerve deficit, dysarthria or facial asymmetry.  Psychiatric:        Mood and Affect: Mood normal.        Speech: Speech normal.        Behavior: Behavior normal.    BP 118/64 (BP Location: Left Arm, Patient Position: Sitting, Cuff Size: Normal)   Pulse 83   Temp 97.6 F (36.4 C) (Temporal)   Ht 5\' 2"  (1.575 m)   Wt 130 lb 12.8 oz (59.3 kg)   SpO2 98%   BMI 23.92 kg/m  Wt Readings from Last 3 Encounters:  06/11/21 130 lb 12.8 oz (59.3 kg)  12/06/20 122 lb 8 oz (55.6 kg)  09/11/20 112 lb (50.8 kg)     Health Maintenance Due  Topic Date Due   Hepatitis C Screening  Never done   TETANUS/TDAP  Never done   PAP-Cervical Cytology Screening  Never done   PAP SMEAR-Modifier  Never done   INFLUENZA VACCINE  06/10/2021    There are no preventive care reminders to display for this patient.  Lab Results  Component Value Date   TSH 1.460 12/06/2020   Lab Results  Component Value Date   WBC 8.2 12/06/2020   HGB 15.0 12/06/2020   HCT 44.8 12/06/2020   MCV 104 (H) 12/06/2020   PLT 182.0 03/26/2020   Lab Results  Component Value Date   NA 141 12/06/2020   K 4.5 12/06/2020   CO2 23 12/06/2020   GLUCOSE 89 12/06/2020   BUN 11 12/06/2020   CREATININE 0.79 12/06/2020   BILITOT 0.5 12/06/2020   ALKPHOS 47 12/06/2020   AST 24 12/06/2020   ALT 32 12/06/2020   PROT 7.6 12/06/2020   ALBUMIN 5.0 12/06/2020   CALCIUM 10.2 12/06/2020    ANIONGAP 8 06/18/2018   GFR 100.75 03/26/2020   Lab Results  Component Value Date   CHOL 154 03/26/2020   Lab Results  Component Value Date   HDL 61.50 03/26/2020   Lab Results  Component Value Date   LDLCALC 76  03/26/2020   Lab Results  Component Value Date   TRIG 83.0 03/26/2020   Lab Results  Component Value Date   CHOLHDL 3 03/26/2020   Lab Results  Component Value Date   HGBA1C 5.4 03/26/2020      Assessment & Plan:   Problem List Items Addressed This Visit       Other   New daily persistent headache   Relevant Medications   predniSONE (DELTASONE) 20 MG tablet   Other Relevant Orders   MR Brain W Wo Contrast   MR Brain Wo Contrast   Anxiety   Tension headache - Primary   Relevant Medications   predniSONE (DELTASONE) 20 MG tablet    Meds ordered this encounter  Medications   predniSONE (DELTASONE) 20 MG tablet    Sig: Take 1 tablet (20 mg total) by mouth 2 (two) times daily with a meal for 7 days.    Dispense:  14 tablet    Refill:  0    Follow-up: Return in about 2 weeks (around 06/25/2021).  Will try a short burst of prednisone.  Patient is interested in mindfulness based stress reduction and she was given information on management.  Mliss Sax, MD

## 2021-06-15 ENCOUNTER — Other Ambulatory Visit: Payer: Self-pay

## 2021-06-15 ENCOUNTER — Ambulatory Visit (HOSPITAL_BASED_OUTPATIENT_CLINIC_OR_DEPARTMENT_OTHER)
Admission: RE | Admit: 2021-06-15 | Discharge: 2021-06-15 | Disposition: A | Discharge status: Home / Self Care | Payer: BC Managed Care – PPO | Source: Ambulatory Visit | Attending: Family Medicine | Admitting: Family Medicine

## 2021-06-15 DIAGNOSIS — G4452 New daily persistent headache (NDPH): Secondary | ICD-10-CM | POA: Diagnosis not present

## 2021-06-15 DIAGNOSIS — R519 Headache, unspecified: Secondary | ICD-10-CM | POA: Diagnosis not present

## 2021-06-15 MED ORDER — GADOBUTROL 1 MMOL/ML IV SOLN
6.0000 mL | Freq: Once | INTRAVENOUS | Status: AC | PRN
  Administered 2021-06-15: 6 mL via INTRAVENOUS

## 2021-06-24 ENCOUNTER — Ambulatory Visit (INDEPENDENT_AMBULATORY_CARE_PROVIDER_SITE_OTHER): Payer: BC Managed Care – PPO | Admitting: Psychology

## 2021-06-24 DIAGNOSIS — F4310 Post-traumatic stress disorder, unspecified: Secondary | ICD-10-CM | POA: Diagnosis not present

## 2021-06-24 DIAGNOSIS — F411 Generalized anxiety disorder: Secondary | ICD-10-CM

## 2021-06-25 ENCOUNTER — Encounter: Payer: Self-pay | Admitting: Family Medicine

## 2021-06-25 ENCOUNTER — Ambulatory Visit: Payer: BC Managed Care – PPO | Admitting: Family Medicine

## 2021-06-25 ENCOUNTER — Other Ambulatory Visit: Payer: Self-pay

## 2021-06-25 VITALS — BP 110/68 | HR 75 | Temp 98.4°F | Ht 62.0 in | Wt 133.4 lb

## 2021-06-25 DIAGNOSIS — Z0000 Encounter for general adult medical examination without abnormal findings: Secondary | ICD-10-CM

## 2021-06-25 DIAGNOSIS — F418 Other specified anxiety disorders: Secondary | ICD-10-CM | POA: Diagnosis not present

## 2021-06-25 DIAGNOSIS — G44209 Tension-type headache, unspecified, not intractable: Secondary | ICD-10-CM | POA: Diagnosis not present

## 2021-06-25 NOTE — Progress Notes (Signed)
Established Patient Office Visit  Subjective:  Patient ID: Jamie Shaw, female    DOB: 02-Dec-1990  Age: 30 y.o. MRN: [id number]  CC:  Chief Complaint  Patient presents with   Headache    Follow up on headaches, per patient they are slowly going away.     HPI Georgie Eduardo Solan presents for follow-up of headaches.  She is not certain whether or not the prednisone helped but the headache seem to be resolving on her own.  She was relieved that there was no serious intracranial abnormality.  She denies any allergy symptoms.  She denies any anxiety or depression at this time.  She says that the Lamictal she is taking for seizure prophylaxis seems to have elevated her mood.  She is not exercising to any extent.  She has never had a Pap smear well woman care.  Uses marijuana daily.  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   Caffeine use: 2 cups per day.   Right-handed.   Social Determinants of Health   Financial Resource Strain: Not on file  Food Insecurity: Not on file  Transportation Needs: Not on file  Physical Activity: Not on file  Stress: Not  on file  Social Connections: Not on file  Intimate Partner Violence: Not on file    Outpatient Medications Prior to Visit  Medication Sig Dispense Refill   lamoTRIgine (LAMICTAL) 100 MG tablet Take 1 tablet (100 mg total) by mouth 2 (two) times daily. 60 tablet 11   No facility-administered medications prior to visit.    No Known Allergies  ROS Review of Systems  Constitutional: Negative.   HENT:  Negative for congestion, postnasal drip, rhinorrhea and sneezing.   Respiratory: Negative.    Cardiovascular: Negative.   Gastrointestinal: Negative.   Neurological:  Negative for headaches.  Psychiatric/Behavioral:  Negative for dysphoric mood. The patient is not nervous/anxious.      Objective:    Physical Exam Vitals and nursing note reviewed.  Constitutional:      Appearance: She is well-developed and normal weight.  HENT:     Head: Normocephalic and atraumatic.  Eyes:     General: No scleral icterus.    Extraocular Movements: Extraocular movements intact.     Pupils: Pupils are equal, round, and reactive to light. Pupils are equal.  Cardiovascular:     Heart sounds: Normal heart sounds.  Pulmonary:     Effort: Pulmonary effort is normal.  Breath sounds: Normal breath sounds.  Musculoskeletal:     Cervical back: Normal range of motion and neck supple. No rigidity.  Lymphadenopathy:     Cervical: No cervical adenopathy.  Skin:    General: Skin is warm and dry.  Neurological:     Mental Status: She is alert and oriented to person, place, and time.     Cranial Nerves: No cranial nerve deficit.  Psychiatric:        Mood and Affect: Mood normal.        Behavior: Behavior normal.    BP 110/68 (BP Location: Left Arm, Patient Position: Sitting, Cuff Size: Normal)   Pulse 75   Temp 98.4 F (36.9 C) (Temporal)   Ht 5\' 2"  (1.575 m)   Wt 133 lb 6.4 oz (60.5 kg)   SpO2 99%   BMI 24.40 kg/m  Wt Readings from Last 3 Encounters:  06/25/21 133 lb 6.4 oz (60.5 kg)   06/11/21 130 lb 12.8 oz (59.3 kg)  12/06/20 122 lb 8 oz (55.6 kg)     Health Maintenance Due  Topic Date Due   Hepatitis C Screening  Never done   TETANUS/TDAP  Never done   PAP-Cervical Cytology Screening  Never done   PAP SMEAR-Modifier  Never done   INFLUENZA VACCINE  06/10/2021    There are no preventive care reminders to display for this patient.  Lab Results  Component Value Date   TSH 1.460 12/06/2020   Lab Results  Component Value Date   WBC 8.2 12/06/2020   HGB 15.0 12/06/2020   HCT 44.8 12/06/2020   MCV 104 (H) 12/06/2020   PLT 182.0 03/26/2020   Lab Results  Component Value Date   NA 141 12/06/2020   K 4.5 12/06/2020   CO2 23 12/06/2020   GLUCOSE 89 12/06/2020   BUN 11 12/06/2020   CREATININE 0.79 12/06/2020   BILITOT 0.5 12/06/2020   ALKPHOS 47 12/06/2020   AST 24 12/06/2020   ALT 32 12/06/2020   PROT 7.6 12/06/2020   ALBUMIN 5.0 12/06/2020   CALCIUM 10.2 12/06/2020   ANIONGAP 8 06/18/2018   GFR 100.75 03/26/2020   Lab Results  Component Value Date   CHOL 154 03/26/2020   Lab Results  Component Value Date   HDL 61.50 03/26/2020   Lab Results  Component Value Date   LDLCALC 76 03/26/2020   Lab Results  Component Value Date   TRIG 83.0 03/26/2020   Lab Results  Component Value Date   CHOLHDL 3 03/26/2020   Lab Results  Component Value Date   HGBA1C 5.4 03/26/2020      Assessment & Plan:   Problem List Items Addressed This Visit       Other   Healthcare maintenance   Relevant Orders   Ambulatory referral to Obstetrics / Gynecology   Depression with anxiety - Primary   Tension headache    No orders of the defined types were placed in this encounter.   Follow-up: Return in about 1 year (around 06/25/2022), or if symptoms worsen or fail to improve.   Given information on stress management.  Encouraged her to exercise.  Advised her that there are additional medicines to be taken for depression if need be.  Suggested that  daily marijuana use may lead to dependence and withdrawal symptoms that could include headache. 06/27/2022, MD

## 2021-07-08 ENCOUNTER — Ambulatory Visit (INDEPENDENT_AMBULATORY_CARE_PROVIDER_SITE_OTHER): Payer: BC Managed Care – PPO | Admitting: Psychology

## 2021-07-08 DIAGNOSIS — F411 Generalized anxiety disorder: Secondary | ICD-10-CM

## 2021-07-08 DIAGNOSIS — F4310 Post-traumatic stress disorder, unspecified: Secondary | ICD-10-CM

## 2021-07-22 ENCOUNTER — Ambulatory Visit (INDEPENDENT_AMBULATORY_CARE_PROVIDER_SITE_OTHER): Payer: BC Managed Care – PPO | Admitting: Psychology

## 2021-07-22 DIAGNOSIS — F411 Generalized anxiety disorder: Secondary | ICD-10-CM | POA: Diagnosis not present

## 2021-07-22 DIAGNOSIS — F4310 Post-traumatic stress disorder, unspecified: Secondary | ICD-10-CM | POA: Diagnosis not present

## 2021-08-05 ENCOUNTER — Ambulatory Visit (INDEPENDENT_AMBULATORY_CARE_PROVIDER_SITE_OTHER): Payer: BC Managed Care – PPO | Admitting: Psychology

## 2021-08-05 DIAGNOSIS — F411 Generalized anxiety disorder: Secondary | ICD-10-CM | POA: Diagnosis not present

## 2021-08-05 DIAGNOSIS — F4310 Post-traumatic stress disorder, unspecified: Secondary | ICD-10-CM

## 2021-08-13 ENCOUNTER — Other Ambulatory Visit (HOSPITAL_COMMUNITY)
Admission: RE | Admit: 2021-08-13 | Discharge: 2021-08-13 | Disposition: A | Discharge status: Home / Self Care | Payer: BC Managed Care – PPO | Source: Ambulatory Visit | Attending: Family Medicine | Admitting: Family Medicine

## 2021-08-13 ENCOUNTER — Ambulatory Visit (INDEPENDENT_AMBULATORY_CARE_PROVIDER_SITE_OTHER): Payer: BC Managed Care – PPO | Admitting: Family Medicine

## 2021-08-13 ENCOUNTER — Other Ambulatory Visit: Payer: Self-pay

## 2021-08-13 ENCOUNTER — Encounter: Payer: Self-pay | Admitting: Family Medicine

## 2021-08-13 VITALS — BP 123/79 | HR 88 | Wt 133.0 lb

## 2021-08-13 DIAGNOSIS — Z124 Encounter for screening for malignant neoplasm of cervix: Secondary | ICD-10-CM

## 2021-08-13 DIAGNOSIS — Z01419 Encounter for gynecological examination (general) (routine) without abnormal findings: Secondary | ICD-10-CM

## 2021-08-13 DIAGNOSIS — Z113 Encounter for screening for infections with a predominantly sexual mode of transmission: Secondary | ICD-10-CM | POA: Insufficient documentation

## 2021-08-13 NOTE — Progress Notes (Signed)
STI testing today No birth control

## 2021-08-13 NOTE — Progress Notes (Signed)
   GYNECOLOGY OFFICE VISIT NOTE  History:   Jamie Shaw is a 30 y.o. female here today for her first pap smear and STD screening. She denies any vaginal discharge, dysuria, or pelvic pain. She is not interested in pursuing birth control at this time. She has no other concerns today.    Past Medical History:  Diagnosis Date   Seizures [facility])     Past Surgical History:  Procedure Laterality Date   no past surgery     The following portions of the patient's history were reviewed and updated as appropriate: allergies, current medications, past family history, past medical history, past social history, past surgical history and problem list.   Health Maintenance:  No prior pap smear completed.  Review of Systems:  Pertinent items noted in HPI and remainder of comprehensive ROS otherwise negative.  Physical Exam:  BP 123/79   Pulse 88   Wt 133 lb (60.3 kg)   LMP 07/30/2021   BMI 24.33 kg/m   CONSTITUTIONAL: Well-developed, well-nourished female in no acute distress.  HEENT:  Normocephalic, atraumatic. EOMI, conjunctivae clear. CARDIOVASCULAR: Normal heart rate noted. RESPIRATORY: Normal work of breathing on room air.  PELVIC: Normal appearing external genitalia; normal urethral meatus; normal appearing vaginal mucosa and cervix.  No abnormal discharge noted. Performed in the presence of a chaperone. SKIN: No rashes or lesions noted. MUSCULOSKELETAL: Normal range of motion. No LE edema noted. NEUROLOGIC: Alert and oriented to person, place, and time. No focal deficit noted.  PSYCHIATRIC: Normal mood and affect. Normal behavior. Normal judgment and thought content.  Assessment and Plan:   1. Cervical cancer screening 2. Screening for STD (sexually transmitted disease) Pap smear and STI screening completed today. Exam as above normal and reassuring. Will follow up results. Follow up annual exam in one year, sooner as needed.  - Cytology - PAP( [place]) - Cervicovaginal  ancillary only( [place])  Routine preventative health maintenance measures emphasized.  Please refer to After Visit Summary for other counseling recommendations.   Return in about 1 year (around 08/13/2022) for next annual exam, sooner as needed.    Evalina Field, MD OB Fellow, Faculty [facility], Center for [facility] 08/13/2021 9:44 AM

## 2021-08-14 LAB — CERVICOVAGINAL ANCILLARY ONLY
Bacterial Vaginitis (gardnerella): NEGATIVE
Candida Glabrata: NEGATIVE
Candida Vaginitis: NEGATIVE
Chlamydia: NEGATIVE
Comment: NEGATIVE
Comment: NEGATIVE
Comment: NEGATIVE
Comment: NEGATIVE
Comment: NEGATIVE
Comment: NORMAL
Neisseria Gonorrhea: NEGATIVE
Trichomonas: NEGATIVE

## 2021-08-15 LAB — CYTOLOGY - PAP
Diagnosis: NEGATIVE
Diagnosis: REACTIVE

## 2021-08-19 ENCOUNTER — Ambulatory Visit (INDEPENDENT_AMBULATORY_CARE_PROVIDER_SITE_OTHER): Payer: BC Managed Care – PPO | Admitting: Psychology

## 2021-08-19 DIAGNOSIS — F4310 Post-traumatic stress disorder, unspecified: Secondary | ICD-10-CM | POA: Diagnosis not present

## 2021-08-19 DIAGNOSIS — F411 Generalized anxiety disorder: Secondary | ICD-10-CM | POA: Diagnosis not present

## 2021-09-02 ENCOUNTER — Ambulatory Visit (INDEPENDENT_AMBULATORY_CARE_PROVIDER_SITE_OTHER): Payer: BC Managed Care – PPO | Admitting: Psychology

## 2021-09-02 DIAGNOSIS — F411 Generalized anxiety disorder: Secondary | ICD-10-CM | POA: Diagnosis not present

## 2021-09-02 DIAGNOSIS — F4310 Post-traumatic stress disorder, unspecified: Secondary | ICD-10-CM

## 2021-09-16 ENCOUNTER — Ambulatory Visit (INDEPENDENT_AMBULATORY_CARE_PROVIDER_SITE_OTHER): Payer: BC Managed Care – PPO | Admitting: Psychology

## 2021-09-16 DIAGNOSIS — F411 Generalized anxiety disorder: Secondary | ICD-10-CM

## 2021-09-16 DIAGNOSIS — F4310 Post-traumatic stress disorder, unspecified: Secondary | ICD-10-CM | POA: Diagnosis not present

## 2021-09-23 ENCOUNTER — Encounter: Payer: Self-pay | Admitting: Neurology

## 2021-09-23 ENCOUNTER — Ambulatory Visit: Payer: BC Managed Care – PPO | Admitting: Neurology

## 2021-09-23 VITALS — BP 111/73 | HR 76 | Ht 63.0 in | Wt 131.0 lb

## 2021-09-23 DIAGNOSIS — E538 Deficiency of other specified B group vitamins: Secondary | ICD-10-CM

## 2021-09-23 DIAGNOSIS — F32A Depression, unspecified: Secondary | ICD-10-CM | POA: Insufficient documentation

## 2021-09-23 DIAGNOSIS — G40909 Epilepsy, unspecified, not intractable, without status epilepticus: Secondary | ICD-10-CM | POA: Diagnosis not present

## 2021-09-23 MED ORDER — LAMOTRIGINE 100 MG PO TABS: 100.0000 mg | ORAL_TABLET | Freq: Two times a day (BID) | ORAL | 3 refills | Status: DC
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
## 2021-09-25 LAB — CBC WITH DIFFERENTIAL
Basophils Absolute: 0 10*3/uL (ref 0.0–0.2)
Basos: 0 %
EOS (ABSOLUTE): 0.1 10*3/uL (ref 0.0–0.4)
Eos: 1 %
Hematocrit: 43.2 % (ref 34.0–46.6)
Hemoglobin: 14.9 g/dL (ref 11.1–15.9)
Immature Grans (Abs): 0 10*3/uL (ref 0.0–0.1)
Immature Granulocytes: 0 %
Lymphocytes Absolute: 1.6 10*3/uL (ref 0.7–3.1)
Lymphs: 31 %
MCH: 35.7 pg — ABNORMAL HIGH (ref 26.6–33.0)
MCHC: 34.5 g/dL (ref 31.5–35.7)
MCV: 104 fL — ABNORMAL HIGH (ref 79–97)
Monocytes Absolute: 0.2 10*3/uL (ref 0.1–0.9)
Monocytes: 5 %
Neutrophils Absolute: 3.3 10*3/uL (ref 1.4–7.0)
Neutrophils: 63 %
RBC: 4.17 x10E6/uL (ref 3.77–5.28)
RDW: 11.4 % — ABNORMAL LOW (ref 11.7–15.4)
WBC: 5.3 10*3/uL (ref 3.4–10.8)

## 2021-09-25 LAB — COMPREHENSIVE METABOLIC PANEL
ALT: 16 IU/L (ref 0–32)
AST: 19 IU/L (ref 0–40)
Albumin/Globulin Ratio: 2.3 — ABNORMAL HIGH (ref 1.2–2.2)
Albumin: 5.2 g/dL — ABNORMAL HIGH (ref 3.9–5.0)
Alkaline Phosphatase: 47 IU/L (ref 44–121)
BUN/Creatinine Ratio: 11 (ref 9–23)
BUN: 9 mg/dL (ref 6–20)
Bilirubin Total: 0.5 mg/dL (ref 0.0–1.2)
CO2: 24 mmol/L (ref 20–29)
Calcium: 10 mg/dL (ref 8.7–10.2)
Chloride: 101 mmol/L (ref 96–106)
Creatinine, Ser: 0.8 mg/dL (ref 0.57–1.00)
Globulin, Total: 2.3 g/dL (ref 1.5–4.5)
Glucose: 96 mg/dL (ref 70–99)
Potassium: 4.8 mmol/L (ref 3.5–5.2)
Sodium: 139 mmol/L (ref 134–144)
Total Protein: 7.5 g/dL (ref 6.0–8.5)
eGFR: 103 mL/min/{1.73_m2} (ref >59)

## 2021-09-25 LAB — TSH: TSH: 1.3 u[IU]/mL (ref 0.450–4.500)

## 2021-09-25 LAB — LAMOTRIGINE LEVEL: Lamotrigine Lvl: 9.9 ug/mL (ref 2.0–20.0)

## 2021-09-28 ENCOUNTER — Encounter: Payer: Self-pay | Admitting: Neurology

## 2021-09-30 ENCOUNTER — Ambulatory Visit: Payer: BC Managed Care – PPO | Admitting: Psychology

## 2021-10-14 ENCOUNTER — Ambulatory Visit (INDEPENDENT_AMBULATORY_CARE_PROVIDER_SITE_OTHER): Payer: BC Managed Care – PPO | Admitting: Psychology

## 2021-10-14 DIAGNOSIS — F4310 Post-traumatic stress disorder, unspecified: Secondary | ICD-10-CM

## 2021-10-14 DIAGNOSIS — F411 Generalized anxiety disorder: Secondary | ICD-10-CM | POA: Diagnosis not present
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
## 2021-11-18 ENCOUNTER — Ambulatory Visit: Payer: BC Managed Care – PPO | Admitting: Psychology

## 2022-02-18 ENCOUNTER — Encounter: Payer: Self-pay | Admitting: Family Medicine

## 2022-02-24 ENCOUNTER — Encounter: Payer: Self-pay | Admitting: Family Medicine

## 2022-02-24 ENCOUNTER — Ambulatory Visit: Payer: BC Managed Care – PPO | Admitting: Family Medicine

## 2022-02-24 VITALS — BP 108/68 | HR 93 | Temp 97.5°F | Ht 63.0 in | Wt 145.0 lb

## 2022-02-24 DIAGNOSIS — R350 Frequency of micturition: Secondary | ICD-10-CM

## 2022-02-24 DIAGNOSIS — L710 Perioral dermatitis: Secondary | ICD-10-CM | POA: Insufficient documentation

## 2022-02-24 LAB — POCT URINALYSIS DIPSTICK OB
Bilirubin, UA: NEGATIVE
Blood, UA: NEGATIVE
Glucose, UA: NEGATIVE
Ketones, UA: NEGATIVE
Leukocytes, UA: NEGATIVE
Nitrite, UA: NEGATIVE
POC,PROTEIN,UA: NEGATIVE
Spec Grav, UA: 1.015 (ref 1.010–1.025)
Urobilinogen, UA: 0.2 E.U./dL
pH, UA: 7.5 (ref 5.0–8.0)

## 2022-02-24 MED ORDER — DOXYCYCLINE HYCLATE 100 MG PO TABS: 100.0000 mg | ORAL_TABLET | Freq: Two times a day (BID) | ORAL | 0 refills | Status: AC

## 2022-02-24 NOTE — Progress Notes (Signed)
? ?Established Patient Office Visit ? ?Subjective:  ?Patient ID: Jamie Shaw, female    DOB: September 22, 1991  Age: 31 y.o. MRN: [id number] ? ?CC:  ?Chief Complaint  ?Patient presents with  ? bumps  ?  Concerns about bumps on face that's staring to spread all over body. Little itchy sometimes. Also urine frequency.   ? ? ?HPI ?Jamie Shaw presents for follow-up of bumps around her mouth.  They come and go.  They can be pustular.  She reports a week history of urinary frequency.  She denies dysuria urgency, vaginal discharge. ? ?Past Medical History:  ?Diagnosis Date  ? Seizures ([place])   ? ? ?Past Surgical History:  ?Procedure Laterality Date  ? no past surgery    ? ? ?Family History  ?Problem Relation Age of Onset  ? Healthy Mother   ? Healthy Father   ? ? ?Social History  ? ?Socioeconomic History  ? Marital status: Single  ?  Spouse name: Not on file  ? Number of children: 0  ? Years of education: college  ? Highest education level: Bachelor's degree (e.g., BA, AB, BS)  ?Occupational History  ? Occupation: cashier/stocks at [employer]  ?Tobacco Use  ? Smoking status: Never  ? Smokeless tobacco: Never  ?Vaping Use  ? Vaping Use: Never used  ?Substance and Sexual Activity  ? Alcohol use: No  ? Drug use: Yes  ?  Types: Marijuana  ?  Comment: smokes daily  ? Sexual activity: Yes  ?  Birth control/protection: None  ?Other Topics Concern  ? Not on file  ?Social History Narrative  ? Lives at home with significant other.  ? Caffeine use: 2 cups per day.  ? Right-handed.  ? ?Social Determinants of Health  ? ?Financial Resource Strain: Not on file  ?Food Insecurity: Not on file  ?Transportation Needs: Not on file  ?Physical Activity: Not on file  ?Stress: Not on file  ?Social Connections: Not on file  ?Intimate Partner Violence: Not on file  ? ? ?Outpatient Medications Prior to Visit  ?Medication Sig Dispense Refill  ? lamoTRIgine (LAMICTAL) 100 MG tablet Take 1 tablet (100 mg total) by mouth 2 (two) times daily. 180 tablet 3   ? ?No facility-administered medications prior to visit.  ? ? ?No Known Allergies ? ?ROS ?Review of Systems  ?Constitutional:  Negative for chills, diaphoresis, fatigue, fever and unexpected weight change.  ?Respiratory: Negative.    ?Cardiovascular: Negative.   ?Gastrointestinal: Negative.   ?Genitourinary:  Positive for frequency. Negative for difficulty urinating, dysuria, flank pain, urgency and vaginal discharge.  ?Skin:  Positive for rash.  ?Neurological:  Negative for speech difficulty, weakness and light-headedness.  ?Psychiatric/Behavioral: Negative.    ? ?  ?Objective:  ?  ?Physical Exam ?Vitals and nursing note reviewed.  ?Constitutional:   ?   General: She is not in acute distress. ?   Appearance: Normal appearance. She is not ill-appearing, toxic-appearing or diaphoretic.  ?HENT:  ?   Head: Normocephalic and atraumatic.  ?   Right Ear: Tympanic membrane, ear canal and external ear normal.  ?   Left Ear: Tympanic membrane, ear canal and external ear normal.  ?   Mouth/Throat:  ?   Mouth: Mucous membranes are moist.  ?   Pharynx: Oropharynx is clear. No oropharyngeal exudate or posterior oropharyngeal erythema.  ?Eyes:  ?   Extraocular Movements: Extraocular movements intact.  ?   Conjunctiva/sclera: Conjunctivae normal.  ?   Pupils: Pupils are equal,  round, and reactive to light.  ?Cardiovascular:  ?   Rate and Rhythm: Normal rate and regular rhythm.  ?Pulmonary:  ?   Effort: Pulmonary effort is normal.  ?   Breath sounds: Normal breath sounds.  ?Abdominal:  ?   General: Bowel sounds are normal.  ?Musculoskeletal:  ?   Cervical back: No rigidity or tenderness.  ?Lymphadenopathy:  ?   Cervical: No cervical adenopathy.  ?Skin: ?   General: Skin is warm and dry.  ? ?    ?Neurological:  ?   Mental Status: She is alert and oriented to person, place, and time.  ?Psychiatric:     ?   Mood and Affect: Mood normal.     ?   Behavior: Behavior normal.  ? ? ?BP 108/68 (BP Location: Left Arm, Patient Position:  Sitting, Cuff Size: Normal)   Pulse 93   Temp (!) 97.5 ?F (36.4 ?C) (Temporal)   Ht _0  (1.6 m)   Wt 145 lb (65.8 kg)   SpO2 99%   BMI 25.69 kg/m?  ?Wt Readings from Last 3 Encounters:  ?02/24/22 145 lb (65.8 kg)  ?09/23/21 131 lb (59.4 kg)  ?08/13/21 133 lb (60.3 kg)  ? ? ? ?Health Maintenance Due  ?Topic Date Due  ? Hepatitis C Screening  Never done  ? ? ?There are no preventive care reminders to display for this patient. ? ?Lab Results  ?Component Value Date  ? TSH 1.300 09/23/2021  ? ?Lab Results  ?Component Value Date  ? WBC 5.3 09/23/2021  ? HGB 14.9 09/23/2021  ? HCT 43.2 09/23/2021  ? MCV 104 (H) 09/23/2021  ? PLT 182.0 03/26/2020  ? ?Lab Results  ?Component Value Date  ? NA 139 09/23/2021  ? K 4.8 09/23/2021  ? CO2 24 09/23/2021  ? GLUCOSE 96 09/23/2021  ? BUN 9 09/23/2021  ? CREATININE 0.80 09/23/2021  ? BILITOT 0.5 09/23/2021  ? ALKPHOS 47 09/23/2021  ? AST 19 09/23/2021  ? ALT 16 09/23/2021  ? PROT 7.5 09/23/2021  ? ALBUMIN 5.2 (H) 09/23/2021  ? CALCIUM 10.0 09/23/2021  ? ANIONGAP 8 06/18/2018  ? EGFR 103 09/23/2021  ? GFR 100.75 03/26/2020  ? ?Lab Results  ?Component Value Date  ? CHOL 154 03/26/2020  ? ?Lab Results  ?Component Value Date  ? HDL 61.50 03/26/2020  ? ?Lab Results  ?Component Value Date  ? [place] 76 03/26/2020  ? ?Lab Results  ?Component Value Date  ? TRIG 83.0 03/26/2020  ? ?Lab Results  ?Component Value Date  ? CHOLHDL 3 03/26/2020  ? ?Lab Results  ?Component Value Date  ? HGBA1C 5.4 03/26/2020  ? ? ?  ?Assessment & Plan:  ? ?Problem List Items Addressed This Visit   ? ?  ? Digestive  ? Perioral dermatitis - Primary  ? Relevant Medications  ? doxycycline (VIBRA-TABS) 100 MG tablet  ?  ? Other  ? Urine frequency  ? Relevant Orders  ? POC Urinalysis Dipstick OB  ? ? ?Meds ordered this encounter  ?Medications  ? doxycycline (VIBRA-TABS) 100 MG tablet  ?  Sig: Take 1 tablet (100 mg total) by mouth 2 (two) times daily for 10 days.  ?  Dispense:  20 tablet  ?  Refill:  0   ? ? ?Follow-up: Return if symptoms worsen or fail to improve.  ?Doxy 100 mg twice daily for 10 days.  Sun sensitivity warnings given.  Urinalysis was normal. ? ?Libby Maw, MD ?
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
## 2022-03-25 ENCOUNTER — Encounter: Payer: Self-pay | Admitting: Neurology

## 2022-03-25 ENCOUNTER — Ambulatory Visit: Payer: BC Managed Care – PPO | Admitting: Neurology

## 2022-03-25 VITALS — BP 127/73 | HR 76 | Ht 63.0 in | Wt 134.0 lb

## 2022-03-25 DIAGNOSIS — G40909 Epilepsy, unspecified, not intractable, without status epilepticus: Secondary | ICD-10-CM

## 2022-03-25 MED ORDER — LAMOTRIGINE 100 MG PO TABS: 100.0000 mg | ORAL_TABLET | Freq: Two times a day (BID) | ORAL | 3 refills | Status: DC

## 2022-03-25 NOTE — Patient Instructions (Signed)
Meds ordered this encounter  ?Medications  ? lamoTRIgine (LAMICTAL) 100 MG tablet  ?  Sig: Take 1 tablet (100 mg total) by mouth 2 (two) times daily.  ?  Dispense:  180 tablet  ?  Refill:  3  ? ?Check labs today  ?Call for any seizure like spells ?Continue to see your pcp ?See a dermatologist about the rash ?Recommend 1 mg daily folic acid when on Lamictal when considering childbearing ?See you back in 1 year ?

## 2022-03-27 ENCOUNTER — Encounter: Payer: Self-pay | Admitting: Neurology

## 2022-03-27 LAB — COMPREHENSIVE METABOLIC PANEL
ALT: 20 IU/L (ref 0–32)
AST: 21 IU/L (ref 0–40)
Albumin/Globulin Ratio: 2 (ref 1.2–2.2)
Albumin: 4.9 g/dL (ref 3.9–5.0)
Alkaline Phosphatase: 58 IU/L (ref 44–121)
BUN/Creatinine Ratio: 8 — ABNORMAL LOW (ref 9–23)
BUN: 6 mg/dL (ref 6–20)
Bilirubin Total: 0.6 mg/dL (ref 0.0–1.2)
CO2: 23 mmol/L (ref 20–29)
Calcium: 9.7 mg/dL (ref 8.7–10.2)
Chloride: 103 mmol/L (ref 96–106)
Creatinine, Ser: 0.8 mg/dL (ref 0.57–1.00)
Globulin, Total: 2.4 g/dL (ref 1.5–4.5)
Glucose: 84 mg/dL (ref 70–99)
Potassium: 4.5 mmol/L (ref 3.5–5.2)
Sodium: 141 mmol/L (ref 134–144)
Total Protein: 7.3 g/dL (ref 6.0–8.5)
eGFR: 103 mL/min/{1.73_m2} (ref >59)

## 2022-03-27 LAB — CBC WITH DIFFERENTIAL/PLATELET
Basophils Absolute: 0 10*3/uL (ref 0.0–0.2)
Basos: 0 %
EOS (ABSOLUTE): 0.1 10*3/uL (ref 0.0–0.4)
Eos: 1 %
Hematocrit: 40 % (ref 34.0–46.6)
Hemoglobin: 14.2 g/dL (ref 11.1–15.9)
Immature Grans (Abs): 0 10*3/uL (ref 0.0–0.1)
Immature Granulocytes: 0 %
Lymphocytes Absolute: 1.9 10*3/uL (ref 0.7–3.1)
Lymphs: 34 %
MCH: 36 pg — ABNORMAL HIGH (ref 26.6–33.0)
MCHC: 35.5 g/dL (ref 31.5–35.7)
MCV: 102 fL — ABNORMAL HIGH (ref 79–97)
Monocytes Absolute: 0.3 10*3/uL (ref 0.1–0.9)
Monocytes: 6 %
Neutrophils Absolute: 3.3 10*3/uL (ref 1.4–7.0)
Neutrophils: 59 %
Platelets: 231 10*3/uL (ref 150–450)
RBC: 3.94 x10E6/uL (ref 3.77–5.28)
RDW: 11.2 % — ABNORMAL LOW (ref 11.7–15.4)
WBC: 5.6 10*3/uL (ref 3.4–10.8)

## 2022-03-27 LAB — LAMOTRIGINE LEVEL: Lamotrigine Lvl: 6.5 ug/mL (ref 2.0–20.0)

## 2022-03-27 NOTE — Telephone Encounter (Signed)
Pt has sent message inquiring on her labs. Main concern is the MCV, MCH, and RDW.  Upon review of the chart MCV has had a high trend over the last 6 years, [facility] high trend over the last year, and RDW minimally low over the last year.  Most likely clinically insignificant but will have NP review to verify. We are still waiting on the lamotrigine level.

## 2022-04-26 ENCOUNTER — Encounter: Payer: Self-pay | Admitting: Family Medicine

## 2022-04-26 DIAGNOSIS — L710 Perioral dermatitis: Secondary | ICD-10-CM

## 2022-05-22 ENCOUNTER — Encounter: Payer: Self-pay | Admitting: Neurology

## 2022-05-22 NOTE — Telephone Encounter (Signed)
I called pt and f/u has been scheduled for 05/29/2022. This is a day where Dr. Terrace Arabia should be in the clinic.

## 2022-05-29 ENCOUNTER — Ambulatory Visit: Payer: BC Managed Care – PPO | Admitting: Neurology

## 2022-05-29 ENCOUNTER — Encounter: Payer: Self-pay | Admitting: Neurology

## 2022-05-29 VITALS — BP 115/77 | HR 85 | Ht 63.0 in | Wt 158.0 lb

## 2022-05-29 DIAGNOSIS — G40909 Epilepsy, unspecified, not intractable, without status epilepticus: Secondary | ICD-10-CM | POA: Diagnosis not present

## 2022-05-29 MED ORDER — FOLIC ACID 1 MG PO TABS: 1.0000 mg | ORAL_TABLET | Freq: Every day | ORAL | 11 refills | Status: DC
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
## 2022-05-29 NOTE — Patient Instructions (Signed)
Please take Folic acid 1 mg daily along with lamictal  Discuss your upcoming pregnancy plans with GYN Check EEG

## 2022-06-09 ENCOUNTER — Ambulatory Visit: Payer: BC Managed Care – PPO | Admitting: Neurology

## 2022-06-09 DIAGNOSIS — G40909 Epilepsy, unspecified, not intractable, without status epilepticus: Secondary | ICD-10-CM | POA: Diagnosis not present

## 2022-07-03 ENCOUNTER — Encounter: Payer: Self-pay | Admitting: Neurology

## 2022-07-03 NOTE — Procedures (Signed)
   HISTORY: 31 year old female with seizure-like activity  TECHNIQUE:  This is a routine 16 channel EEG recording with one channel devoted to a limited EKG recording.  It was performed during wakefulness, drowsiness and asleep.  Hyperventilation and photic stimulation were performed as activating procedures.  There are minimum muscle and movement artifact noted.  Upon maximum arousal, posterior dominant waking rhythm consistent of rhythmic alpha range activity, . Activities are symmetric over the bilateral posterior derivations and attenuated with eye opening.  Hyperventilation produced mild/moderate buildup with higher amplitude and the slower activities noted.  Photic stimulation did not alter the tracing.  During EEG recording, patient developed drowsiness and no deeper stage of sleep was achieved During EEG recording, there was no epileptiform discharge noted.  EKG demonstrate sinus rhythm, with heart rate of 68 bpm  CONCLUSION: This is a  normal awake EEG.  There is no electrodiagnostic evidence of epileptiform discharge.  Levert Feinstein, M.D. Ph.D.  [facility] [address] [place], [place] [address] Phone: [phone] Fax:      [phone]

## 2022-07-07 NOTE — Telephone Encounter (Signed)
I reviewed the chart. Last office visit was 05/29/2022 with plan as follows:  Please take Folic acid 1 mg daily along with lamictal  Discuss your upcoming pregnancy plans with GYN Check EEG Follow up in 6 months.   Will forward to NP for review.

## 2022-08-04 ENCOUNTER — Encounter: Payer: Self-pay | Admitting: Family Medicine

## 2022-08-07 ENCOUNTER — Ambulatory Visit: Payer: BC Managed Care – PPO | Admitting: Family Medicine

## 2022-08-07 ENCOUNTER — Encounter: Payer: Self-pay | Admitting: Family Medicine

## 2022-08-07 VITALS — BP 118/68 | HR 73 | Temp 98.6°F | Ht 63.0 in | Wt 157.8 lb

## 2022-08-07 DIAGNOSIS — K921 Melena: Secondary | ICD-10-CM | POA: Diagnosis not present

## 2022-08-07 NOTE — Progress Notes (Signed)
Established Patient Office Visit  Subjective   Patient ID: Jamie Shaw, female    DOB: Sep 09, 1991  Age: 31 y.o. MRN: [id number]  Chief Complaint  Patient presents with   Rectal Bleeding    Concerns about rectal bleeding 3 days ago.     Rectal Bleeding  Pertinent negatives include no abdominal pain, no diarrhea, no nausea, no vomiting and no rash.   for evaluation status post hematochezia accompanied by on toilet paper when wiping 3 days ago.  It has not reoccurred since.  No prior history.  Denies constipation, painful bowel movements or prolonged toilet bowl sitting.  Denies melena.  Denies abdominal pain weight loss night sweats or reflux symptoms.  No family history of colon disease.    Review of Systems  Constitutional: Negative.   HENT: Negative.    Eyes:  Negative for blurred vision, discharge and redness.  Respiratory: Negative.    Cardiovascular: Negative.   Gastrointestinal:  Positive for blood in stool and hematochezia. Negative for abdominal pain, constipation, diarrhea, heartburn, melena, nausea and vomiting.  Genitourinary: Negative.   Musculoskeletal: Negative.  Negative for myalgias.  Skin:  Negative for rash.  Neurological:  Negative for tingling, loss of consciousness and weakness.  Endo/Heme/Allergies:  Negative for polydipsia.      Objective:     BP 118/68 (BP Location: Left Arm, Patient Position: Sitting, Cuff Size: Normal)   Pulse 73   Temp 98.6 F (37 C) (Temporal)   Ht 5\' 3"  (1.6 m)   Wt 157 lb 12.8 oz (71.6 kg)   SpO2 98%   BMI 27.95 kg/m  Wt Readings from Last 3 Encounters:  08/07/22 157 lb 12.8 oz (71.6 kg)  05/29/22 158 lb (71.7 kg)  03/25/22 134 lb (60.8 kg)      Physical Exam Exam conducted with a chaperone present.  Constitutional:      General: She is not in acute distress.    Appearance: Normal appearance. She is not ill-appearing, toxic-appearing or diaphoretic.  HENT:     Head: Normocephalic and atraumatic.     Right Ear:  External ear normal.     Left Ear: External ear normal.     Mouth/Throat:     Mouth: Mucous membranes are moist.     Pharynx: Oropharynx is clear. No oropharyngeal exudate or posterior oropharyngeal erythema.  Eyes:     General: No scleral icterus.       Right eye: No discharge.        Left eye: No discharge.     Extraocular Movements: Extraocular movements intact.     Conjunctiva/sclera: Conjunctivae normal.     Pupils: Pupils are equal, round, and reactive to light.  Cardiovascular:     Rate and Rhythm: Normal rate and regular rhythm.  Pulmonary:     Effort: Pulmonary effort is normal. No respiratory distress.     Breath sounds: Normal breath sounds.  Abdominal:     General: Bowel sounds are normal. There is no distension.     Tenderness: There is no abdominal tenderness. There is no guarding.  Genitourinary:    Exam position: Knee-chest position.    Musculoskeletal:     Cervical back: No rigidity or tenderness.  Skin:    General: Skin is warm and dry.  Neurological:     Mental Status: She is alert and oriented to person, place, and time.  Psychiatric:        Mood and Affect: Mood normal.  Behavior: Behavior normal.      No results found for any visits on 08/07/22.    The ASCVD Risk score (Arnett DK, et al., 2019) failed to calculate for the following reasons:   The 2019 ASCVD risk score is only valid for ages 90 to 33    Assessment & Plan:   Problem List Items Addressed This Visit       Other   Hematochezia - Primary    Return Gastroenterology referral with any reoccurrence.  Let me know, please.  Information given on GI bleeding.  Mliss Sax, MD

## 2022-11-25 ENCOUNTER — Telehealth: Payer: Self-pay | Admitting: Neurology

## 2022-11-25 NOTE — Telephone Encounter (Signed)
LVM and sent mychart msg informing pt of need to reschedule 04/01/23 appointment - NP out 

## 2022-12-11 ENCOUNTER — Ambulatory Visit: Payer: BC Managed Care – PPO | Admitting: Neurology

## 2023-01-28 ENCOUNTER — Encounter: Payer: Self-pay | Admitting: Neurology

## 2023-02-10 IMAGING — MR MR HEAD WO/W CM
13 series · 48 of 48 positions shown · IV contrast (gadavist)
Comparison: None.

CLINICAL DATA: Chronic daily headache.

EXAM:
MRI HEAD WITHOUT AND WITH CONTRAST
TECHNIQUE: Multiplanar, multiecho pulse sequences of the brain and surrounding
structures were obtained without and with intravenous contrast.
CONTRAST:  6mL GADAVIST GADOBUTROL 1 MMOL/ML IV SOLN

[Series 2: T1 · sagittal · 5.0mm · 0.45mm/px · 2 of 23 slices shown]
[im 1/23]
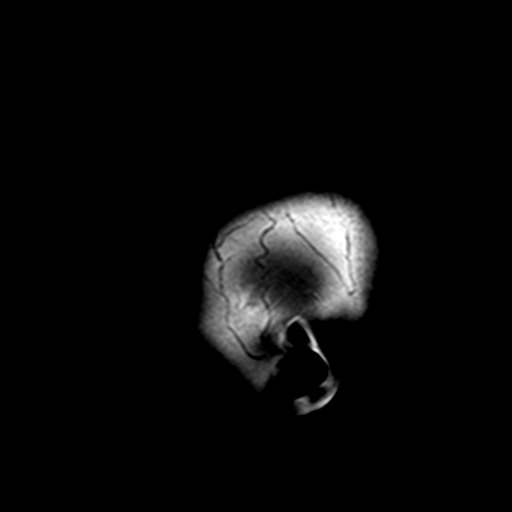
[im 23/23]
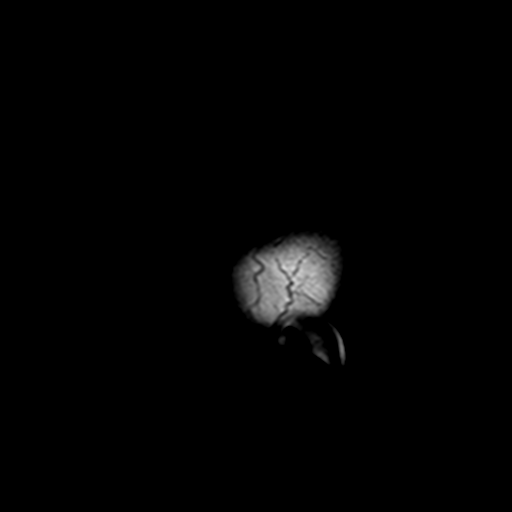

[Series 3: DWI · axial · 3.0mm · 2.03mm/px · z∈[-61,+101]mm · 7 of 109 slices shown (1 of 4)]
[im 1/109]
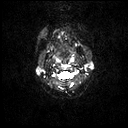
[im 19/109]
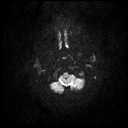
[im 37/109]
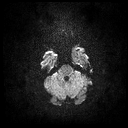
[im 55/109]
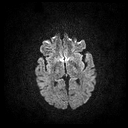
[im 73/109]
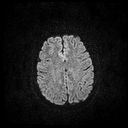
[im 91/109]
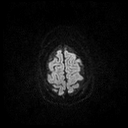
[im 109/109]
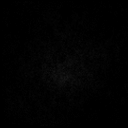

[Series 4: DWI · axial · 3.0mm · 2.03mm/px · z∈[-61,+101]mm · 3 of 54 slices shown (2 of 4)]
[im 1/54]
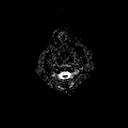
[im 27/54]
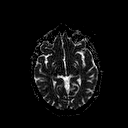
[im 54/54]
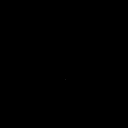

[Series 5: DWI · coronal · 5.0mm · 1.46mm/px · 5 of 76 slices shown (3 of 4)]
[im 1/76]
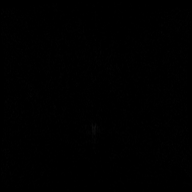
[im 19/76]
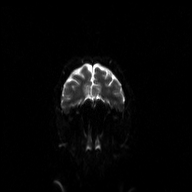
[im 38/76]
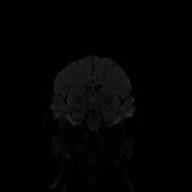
[im 57/76]
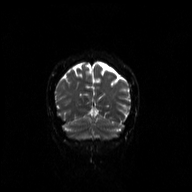
[im 76/76]
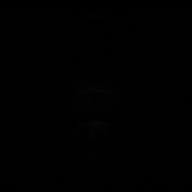

[Series 6: DWI · coronal · 5.0mm · 1.46mm/px · 2 of 38 slices shown (4 of 4)]
[im 1/38]
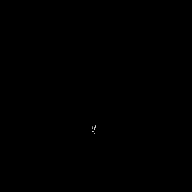
[im 38/38]
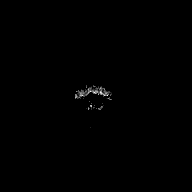

[Series 7: T2 · axial · 5.0mm · 0.43mm/px · 1 of 24 slices shown (1 of 2)]
[im 1/24]
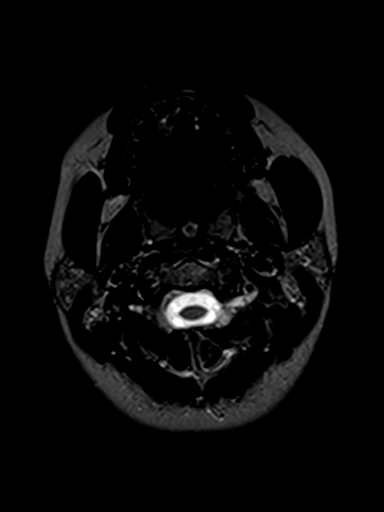

[Series 8: FLAIR · axial · 3.0mm · 0.36mm/px · z∈[-54,+97]mm · 2 of 39 slices shown]
[im 1/39]
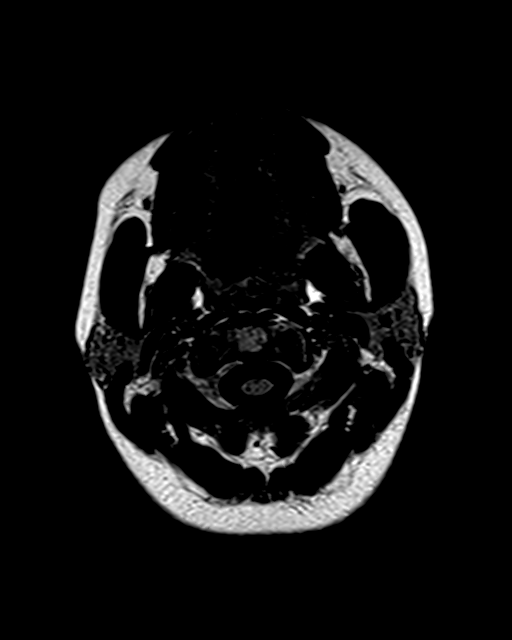
[im 39/39]
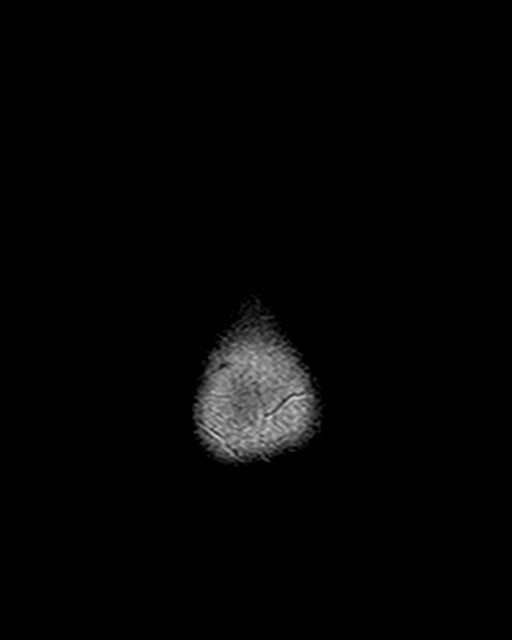

[Series 9: t1_mprage_tra · axial · 1.0mm · 0.90mm/px · z∈[-58,+101]mm · 10 of 160 slices shown]
[im 1/160]
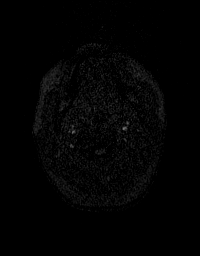
[im 18/160]
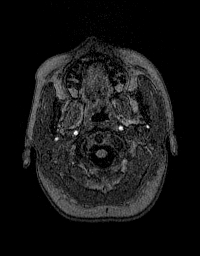
[im 36/160]
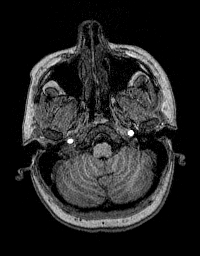
[im 54/160]
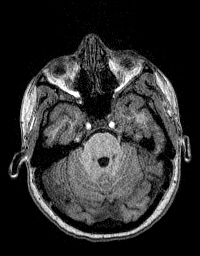
[im 71/160]
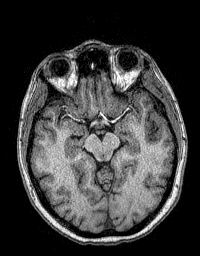
[im 89/160]
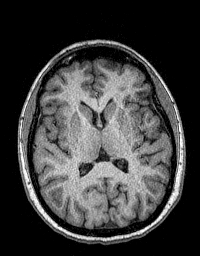
[im 107/160]
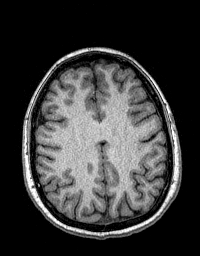
[im 124/160]
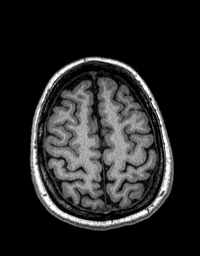
[im 142/160]
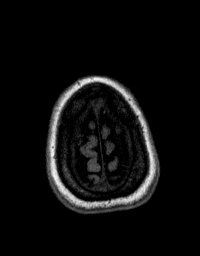
[im 160/160]
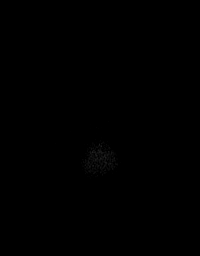

[Series 10: T2 · axial · 5.0mm · 0.45mm/px · 1 of 24 slices shown (2 of 2)]
[im 1/24]
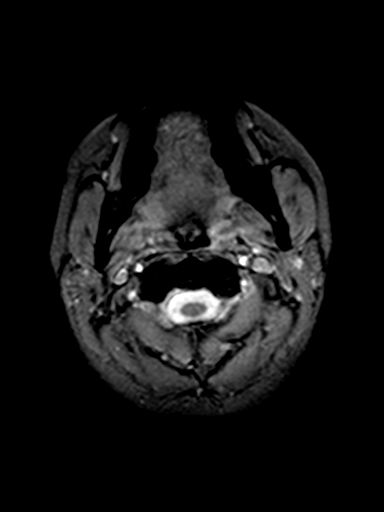

[Series 11: T2 post-contrast · coronal · 5.0mm · 0.45mm/px · 2 of 28 slices shown]
[im 1/28]
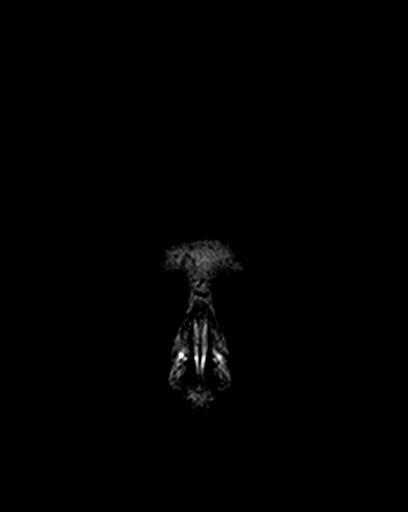
[im 28/28]
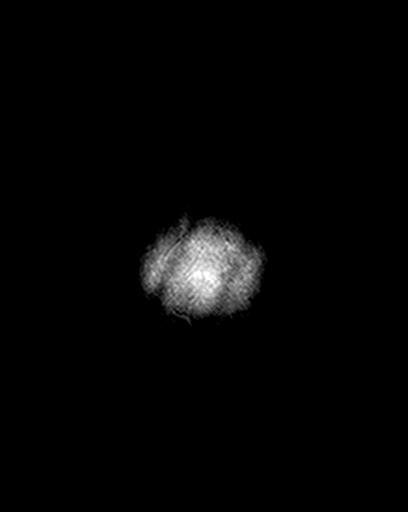

[Series 12: t1_mprage_tra post · axial · 1.0mm · 0.90mm/px · z∈[-58,+101]mm · 10 of 160 slices shown]
[im 1/160]
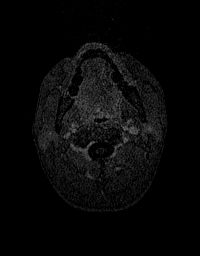
[im 18/160]
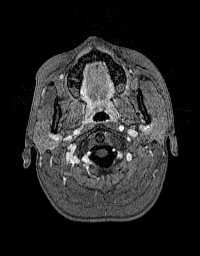
[im 36/160]
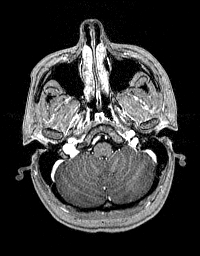
[im 54/160]
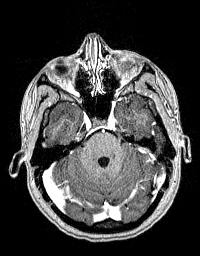
[im 71/160]
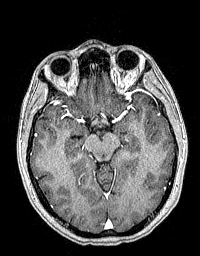
[im 89/160]
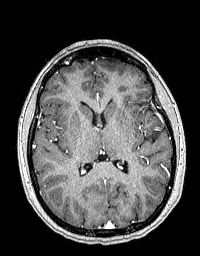
[im 107/160]
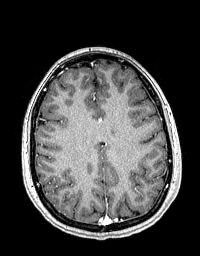
[im 124/160]
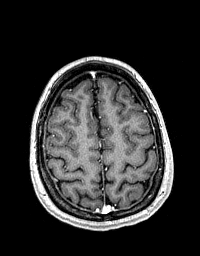
[im 142/160]
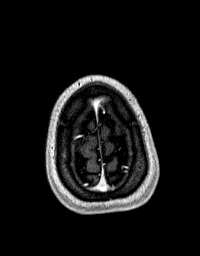
[im 160/160]
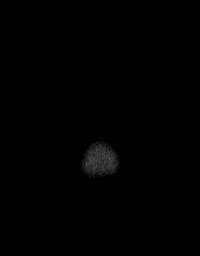

[Series 13: T1 post-contrast · coronal · 5.0mm · 0.45mm/px · 2 of 28 slices shown]
[im 1/28]
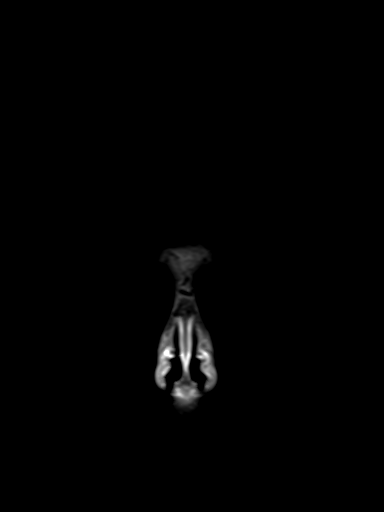
[im 28/28]
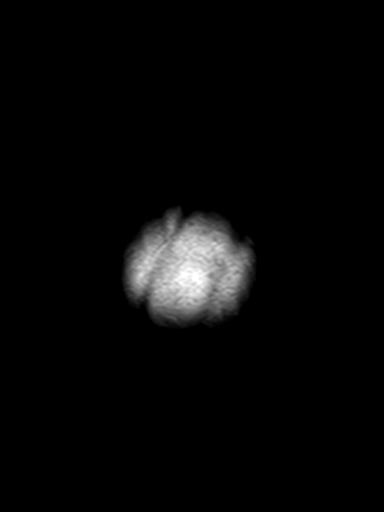

[Series 100: hx · axial · 6.0mm · 0.55mm/px · 1 of 12 slices shown]
[im 1/12]
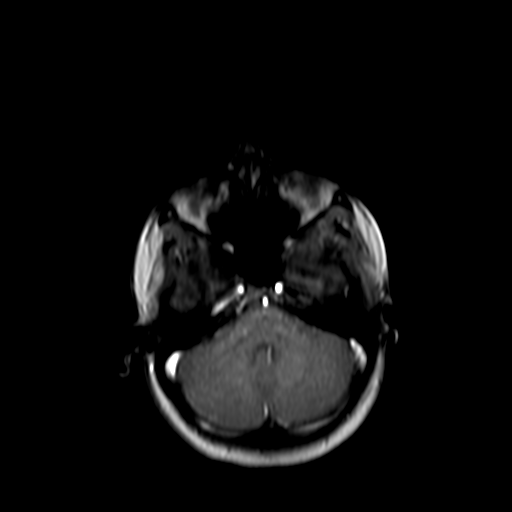

[48 of 48 positions shown; findings below may reference images not displayed]

FINDINGS: Brain: No infarction, hemorrhage, hydrocephalus, extra-axial
collection or mass lesion. No white matter disease, atrophy, or
abnormal enhancement

Vascular: Normal flow voids.  Normal vessel enhancements

Skull and upper cervical spine: Normal marrow signal.

Sinuses/Orbits: Normal.  No sinusitis.
IMPRESSION: Normal brain MRI.

## 2023-03-11 ENCOUNTER — Encounter: Payer: Self-pay | Admitting: Neurology

## 2023-03-11 ENCOUNTER — Telehealth: Payer: Self-pay | Admitting: Neurology

## 2023-03-11 NOTE — Telephone Encounter (Signed)
Would kindly recommend follow up with primary care or psychiatry. Lamictal is for seizure management and has added benefit of mood management. We are not primarily treating her mood disorder. Thanks

## 2023-03-11 NOTE — Telephone Encounter (Signed)
Pt stated she don't think lamoTRIgine (LAMICTAL) 100 MG tablet  is helping the depression part.

## 2023-03-11 NOTE — Telephone Encounter (Signed)
Called and spoke with patient who was agreeable to reach out to PCP. Pt had no questions at this time but was encouraged to call back if questions arise. Pt verbalized understanding.

## 2023-03-16 ENCOUNTER — Encounter: Payer: Self-pay | Admitting: Family Medicine

## 2023-03-16 ENCOUNTER — Ambulatory Visit: Payer: BC Managed Care – PPO | Admitting: Family Medicine

## 2023-03-16 VITALS — BP 120/70 | HR 67 | Temp 97.9°F | Ht 63.0 in | Wt 134.4 lb

## 2023-03-16 DIAGNOSIS — F418 Other specified anxiety disorders: Secondary | ICD-10-CM | POA: Diagnosis not present

## 2023-03-16 MED ORDER — FLUOXETINE HCL 10 MG PO TABS: 10.0000 mg | ORAL_TABLET | Freq: Every day | ORAL | 1 refills | Status: DC

## 2023-03-16 NOTE — Progress Notes (Signed)
Established Patient Office Visit   Subjective:  Patient ID: Jamie Shaw, female    DOB: February 25, 1991  Age: 32 y.o. MRN: [id number]  Chief Complaint  Patient presents with   Depression    Discuss issues with depression and anxiety.     Depression        Associated symptoms include no myalgias and no suicidal ideas.  Encounter Diagnoses  Name Primary?   Depression with anxiety Yes   Here for evaluation of above.  She is accompanied by the grandmother of her fianc.  Patient is particularly close with her feet are involved.  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  Work is hard.  She is planning a wedding for October.  She has been seeing her fianc for over 10 years now.  They have been living together in a home that he purchased.  Things are going well there.  She has been stressed with wedding planning.  Family is helping a great deal.  Continues in counseling for posttraumatic stress disorder.  She has been taking Lamictal for seizure disorder for 2 to 8 years now.  She has been seizure-free.  Has sometimes thought that things would be better if she were not here but has never seriously considered hurting herself.   Review of Systems  Constitutional: Negative.   HENT: Negative.    Eyes:  Negative for blurred vision, discharge and redness.  Respiratory: Negative.    Cardiovascular: Negative.   Gastrointestinal:  Negative for abdominal pain.  Genitourinary: Negative.   Musculoskeletal: Negative.  Negative for myalgias.  Skin:  Negative for rash.  Neurological:  Negative for tingling, loss of consciousness and weakness.  Endo/Heme/Allergies:  Negative for polydipsia.  Psychiatric/Behavioral:  Positive for depression. Negative for substance abuse and suicidal ideas. The patient is nervous/anxious.        03/16/2023    3:00 PM 06/11/2021   10:49 AM 06/11/2021   10:01 AM  Depression screen PHQ 2/9  Decreased Interest 0 0 0  Down, Depressed, Hopeless 2 0 0   PHQ - 2 Score 2 0 0  Altered sleeping  0   Tired, decreased energy  1   Change in appetite  0   Feeling bad or failure about yourself   1   Trouble concentrating  0   Moving slowly or fidgety/restless  0   Suicidal thoughts  0   PHQ-9 Score  2   Difficult doing work/chores  Not difficult at all      Current Outpatient Medications:    FLUoxetine (PROZAC) 10 MG tablet, Take 1 tablet (10 mg total) by mouth daily., Disp: 30 tablet, Rfl: 1   folic acid (FOLVITE) 1 MG tablet, Take 1 tablet (1 mg total) by mouth daily., Disp: 30 tablet, Rfl: 11   lamoTRIgine (LAMICTAL) 100 MG tablet, Take 1 tablet (100 mg total) by mouth 2 (two) times daily., Disp: 180 tablet, Rfl: 3   Objective:     BP 120/70 (BP Location: Left Arm, Patient Position: Sitting, Cuff Size: Normal)   Pulse 67   Temp 97.9 F (36.6 C) (Temporal)   Ht 5\' 3"  (1.6 m)   Wt 134 lb 6.4 oz (61 kg)   SpO2 97%   BMI 23.81 kg/m    Physical Exam Constitutional:      General: She is not in acute distress.    Appearance: Normal appearance. She is not ill-appearing, toxic-appearing or diaphoretic.  HENT:  Head: Normocephalic and atraumatic.     Right Ear: External ear normal.     Left Ear: External ear normal.  Eyes:     General: No scleral icterus.       Right eye: No discharge.        Left eye: No discharge.     Extraocular Movements: Extraocular movements intact.     Conjunctiva/sclera: Conjunctivae normal.  Pulmonary:     Effort: Pulmonary effort is normal. No respiratory distress.  Skin:    General: Skin is warm and dry.  Neurological:     Mental Status: She is alert and oriented to person, place, and time.  Psychiatric:        Mood and Affect: Mood normal.        Behavior: Behavior normal.      No results found for any visits on 03/16/23.    The ASCVD Risk score (Arnett DK, et al., 2019) failed to calculate for the following reasons:   The 2019 ASCVD risk score is only valid for ages 41 to 89     Assessment & Plan:   Depression with anxiety -     FLUoxetine HCl; Take 1 tablet (10 mg total) by mouth daily.  Dispense: 30 tablet; Refill: 1    Return in about 4 weeks (around 04/13/2023).   Will start low-dose Prozac 10 mg daily.  Increase slowly.  There will be augmentation from the Lamictal. Mliss Sax, MD

## 2023-04-01 ENCOUNTER — Telehealth: Payer: BC Managed Care – PPO | Admitting: Neurology

## 2023-04-13 ENCOUNTER — Encounter: Payer: Self-pay | Admitting: Family Medicine

## 2023-04-13 ENCOUNTER — Ambulatory Visit: Payer: BC Managed Care – PPO | Admitting: Family Medicine

## 2023-04-13 VITALS — BP 110/72 | HR 80 | Temp 98.3°F | Ht 63.0 in | Wt 133.6 lb

## 2023-04-13 DIAGNOSIS — F418 Other specified anxiety disorders: Secondary | ICD-10-CM

## 2023-04-13 MED ORDER — FLUOXETINE HCL 10 MG PO TABS: 10.0000 mg | ORAL_TABLET | Freq: Every day | ORAL | 1 refills | Status: DC

## 2023-04-13 NOTE — Progress Notes (Signed)
Established Patient Office Visit   Subjective:  Patient ID: Jamie Shaw, female    DOB: [birth year]  Age: 32 y.o. MRN: [id number]  Chief Complaint  Patient presents with   Anxiety   Depression    No concerns.    Anxiety Patient reports no suicidal ideas.    Depression        Associated symptoms include no myalgias and no suicidal ideas.  Past medical history includes anxiety.    Encounter Diagnoses  Name Primary?   Depression with anxiety Yes   Presents with her future grandmother in law.  Things are going well for the wedding scheduled in October.  Patient is doing better with the fluoxetine at 10 mg.  Anxiety is decreased and there has been an elevation of mood with the lower dose.   Review of Systems  Constitutional: Negative.   HENT: Negative.    Eyes:  Negative for blurred vision, discharge and redness.  Respiratory: Negative.    Cardiovascular: Negative.   Gastrointestinal:  Negative for abdominal pain.  Genitourinary: Negative.   Musculoskeletal: Negative.  Negative for myalgias.  Skin:  Negative for rash.  Neurological:  Negative for tingling, loss of consciousness and weakness.  Endo/Heme/Allergies:  Negative for polydipsia.  Psychiatric/Behavioral:  Positive for depression. Negative for suicidal ideas.       04/13/2023   10:00 AM 03/16/2023    3:50 PM 03/16/2023    3:00 PM  Depression screen PHQ 2/9  Decreased Interest 0 1 0  Down, Depressed, Hopeless 0 1 2  PHQ - 2 Score 0 2 2  Altered sleeping 1 1   Tired, decreased energy 1 1   Change in appetite 0 0   Feeling bad or failure about yourself  0 0   Trouble concentrating 0 0   Moving slowly or fidgety/restless 0 0   Suicidal thoughts 0 1   PHQ-9 Score 2 5   Difficult doing work/chores Not difficult at all Somewhat difficult       Current Outpatient Medications:    folic acid (FOLVITE) 1 MG tablet, Take 1 tablet (1 mg total) by mouth daily., Disp: 30 tablet, Rfl: 11   lamoTRIgine (LAMICTAL) 100 MG  tablet, Take 1 tablet (100 mg total) by mouth 2 (two) times daily., Disp: 180 tablet, Rfl: 3   FLUoxetine (PROZAC) 10 MG tablet, Take 1 tablet (10 mg total) by mouth daily., Disp: 90 tablet, Rfl: 1   Objective:     BP 110/72   Pulse 80   Temp 98.3 F (36.8 C) (Temporal)   Ht 5\' 3"  (1.6 m)   Wt 133 lb 9.6 oz (60.6 kg)   LMP 03/23/2023 (Approximate)   BMI 23.67 kg/m    Physical Exam Constitutional:      General: She is not in acute distress.    Appearance: Normal appearance. She is not ill-appearing, toxic-appearing or diaphoretic.  HENT:     Head: Normocephalic and atraumatic.     Right Ear: External ear normal.     Left Ear: External ear normal.  Eyes:     General: No scleral icterus.       Right eye: No discharge.        Left eye: No discharge.     Extraocular Movements: Extraocular movements intact.     Conjunctiva/sclera: Conjunctivae normal.  Pulmonary:     Effort: Pulmonary effort is normal. No respiratory distress.  Skin:    General: Skin is warm and dry.  Neurological:  Mental Status: She is alert and oriented to person, place, and time.  Psychiatric:        Mood and Affect: Mood normal.        Behavior: Behavior normal.      No results found for any visits on 04/13/23.    The ASCVD Risk score (Arnett DK, et al., 2019) failed to calculate for the following reasons:   The 2019 ASCVD risk score is only valid for ages 57 to 56    Assessment & Plan:   Depression with anxiety -     FLUoxetine HCl; Take 1 tablet (10 mg total) by mouth daily.  Dispense: 90 tablet; Refill: 1    Return in about 3 months (around 07/14/2023), or if symptoms worsen or fail to improve.  Continue fluoxetine 10 mg daily.  Follow-up in 3 months or sooner as needed.  Mliss Sax, MD

## 2023-04-15 ENCOUNTER — Telehealth (INDEPENDENT_AMBULATORY_CARE_PROVIDER_SITE_OTHER): Payer: BC Managed Care – PPO | Admitting: Neurology

## 2023-04-15 DIAGNOSIS — R569 Unspecified convulsions: Secondary | ICD-10-CM

## 2023-04-15 DIAGNOSIS — G40909 Epilepsy, unspecified, not intractable, without status epilepticus: Secondary | ICD-10-CM

## 2023-04-15 MED ORDER — FOLIC ACID 1 MG PO TABS: 1.0000 mg | ORAL_TABLET | Freq: Every day | ORAL | 11 refills | Status: DC

## 2023-04-15 MED ORDER — LAMOTRIGINE 100 MG PO TABS
100.0000 mg | ORAL_TABLET | Freq: Two times a day (BID) | ORAL | 3 refills | Status: DC
Start: 2023-04-15 — End: 2023-12-30
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
## 2023-05-10 ENCOUNTER — Other Ambulatory Visit: Payer: Self-pay | Admitting: Family Medicine

## 2023-05-10 DIAGNOSIS — F418 Other specified anxiety disorders: Secondary | ICD-10-CM

## 2023-06-25 ENCOUNTER — Encounter (INDEPENDENT_AMBULATORY_CARE_PROVIDER_SITE_OTHER): Payer: Self-pay

## 2023-07-20 ENCOUNTER — Ambulatory Visit: Payer: BC Managed Care – PPO | Admitting: Family Medicine

## 2023-10-27 ENCOUNTER — Ambulatory Visit: Payer: BC Managed Care – PPO | Admitting: Neurology

## 2023-11-07 ENCOUNTER — Other Ambulatory Visit: Payer: Self-pay | Admitting: Family Medicine

## 2023-11-07 DIAGNOSIS — F418 Other specified anxiety disorders: Secondary | ICD-10-CM

## 2023-12-29 ENCOUNTER — Telehealth: Payer: Self-pay | Admitting: Neurology

## 2023-12-29 NOTE — Telephone Encounter (Signed)
LVM and sent mychart msg offering pt to do a video visit instead of coming in on 12/30/23

## 2023-12-30 ENCOUNTER — Encounter: Payer: Self-pay | Admitting: Neurology

## 2023-12-30 ENCOUNTER — Telehealth (INDEPENDENT_AMBULATORY_CARE_PROVIDER_SITE_OTHER): Payer: BC Managed Care – PPO | Admitting: Neurology

## 2023-12-30 VITALS — Ht 63.0 in | Wt 140.0 lb

## 2023-12-30 DIAGNOSIS — G40909 Epilepsy, unspecified, not intractable, without status epilepticus: Secondary | ICD-10-CM | POA: Diagnosis not present

## 2023-12-30 MED ORDER — FOLIC ACID 1 MG PO TABS
1.0000 mg | ORAL_TABLET | Freq: Every day | ORAL | 11 refills | Status: AC
Start: 2023-12-30 — End: ?

## 2023-12-30 MED ORDER — LAMOTRIGINE 100 MG PO TABS: 100.0000 mg | ORAL_TABLET | Freq: Two times a day (BID) | ORAL | 3 refills | Status: AC
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
## 2023-12-30 NOTE — Patient Instructions (Signed)
Great to see you today.  Please come by the office in the next few weeks to check routine labs.  Continue Lamictal for seizure prevention.  Continue folic acid 1 mg daily.  Please call for any seizure type events.  Follow-up in 1 year.  Thanks!!

## 2024-01-08 ENCOUNTER — Other Ambulatory Visit: Payer: Self-pay | Admitting: Family Medicine

## 2024-01-08 ENCOUNTER — Encounter: Payer: Self-pay | Admitting: Family Medicine

## 2024-01-08 DIAGNOSIS — F418 Other specified anxiety disorders: Secondary | ICD-10-CM

## 2024-01-11 ENCOUNTER — Other Ambulatory Visit (INDEPENDENT_AMBULATORY_CARE_PROVIDER_SITE_OTHER): Payer: Self-pay

## 2024-01-11 DIAGNOSIS — Z0289 Encounter for other administrative examinations: Secondary | ICD-10-CM

## 2024-01-11 DIAGNOSIS — G40909 Epilepsy, unspecified, not intractable, without status epilepticus: Secondary | ICD-10-CM

## 2024-01-12 ENCOUNTER — Encounter: Payer: Self-pay | Admitting: Neurology

## 2024-01-12 LAB — LAMOTRIGINE LEVEL: Lamotrigine Lvl: 6.4 ug/mL (ref 2.0–20.0)

## 2024-01-12 LAB — CBC WITH DIFFERENTIAL/PLATELET
Basophils Absolute: 0 10*3/uL (ref 0.0–0.2)
Basos: 0 %
EOS (ABSOLUTE): 0.2 10*3/uL (ref 0.0–0.4)
Eos: 3 %
Hematocrit: 40.3 % (ref 34.0–46.6)
Hemoglobin: 14.1 g/dL (ref 11.1–15.9)
Immature Grans (Abs): 0 10*3/uL (ref 0.0–0.1)
Immature Granulocytes: 0 %
Lymphocytes Absolute: 2 10*3/uL (ref 0.7–3.1)
Lymphs: 40 %
MCH: 36.8 pg — ABNORMAL HIGH (ref 26.6–33.0)
MCHC: 35 g/dL (ref 31.5–35.7)
MCV: 105 fL — ABNORMAL HIGH (ref 79–97)
Monocytes Absolute: 0.2 10*3/uL (ref 0.1–0.9)
Monocytes: 5 %
Neutrophils Absolute: 2.6 10*3/uL (ref 1.4–7.0)
Neutrophils: 52 %
Platelets: 217 10*3/uL (ref 150–450)
RBC: 3.83 x10E6/uL (ref 3.77–5.28)
RDW: 11.5 % — ABNORMAL LOW (ref 11.7–15.4)
WBC: 5 10*3/uL (ref 3.4–10.8)

## 2024-01-12 LAB — COMPREHENSIVE METABOLIC PANEL
ALT: 21 IU/L (ref 0–32)
AST: 22 IU/L (ref 0–40)
Albumin: 4.6 g/dL (ref 4.0–5.0)
Alkaline Phosphatase: 67 IU/L (ref 44–121)
BUN/Creatinine Ratio: 13 (ref 9–23)
BUN: 10 mg/dL (ref 6–20)
Bilirubin Total: 0.4 mg/dL (ref 0.0–1.2)
CO2: 22 mmol/L (ref 20–29)
Calcium: 9.6 mg/dL (ref 8.7–10.2)
Chloride: 104 mmol/L (ref 96–106)
Creatinine, Ser: 0.79 mg/dL (ref 0.57–1.00)
Globulin, Total: 2.5 g/dL (ref 1.5–4.5)
Glucose: 107 mg/dL — ABNORMAL HIGH (ref 70–99)
Potassium: 4.5 mmol/L (ref 3.5–5.2)
Sodium: 140 mmol/L (ref 134–144)
Total Protein: 7.1 g/dL (ref 6.0–8.5)
eGFR: 103 mL/min/{1.73_m2} (ref >59)

## 2024-01-25 ENCOUNTER — Encounter: Payer: Self-pay | Admitting: Family Medicine

## 2024-01-25 ENCOUNTER — Ambulatory Visit: Admitting: Family Medicine

## 2024-01-25 DIAGNOSIS — F418 Other specified anxiety disorders: Secondary | ICD-10-CM | POA: Diagnosis not present

## 2024-01-25 MED ORDER — FLUOXETINE HCL 10 MG PO TABS: 10.0000 mg | ORAL_TABLET | Freq: Every day | ORAL | 1 refills | Status: DC

## 2024-01-25 NOTE — Progress Notes (Signed)
 Established Patient Office Visit   Subjective:  Patient ID: Jamie Shaw, female    DOB: Aug 19, 1991  Age: 33 y.o. MRN: [id number]  Chief Complaint  Patient presents with   Medical Management of Chronic Issues    Follow up. Med refill Prozac    HPI Encounter Diagnoses  Name Primary?   Depression with anxiety    Out of medication now for few weeks.  Was due back for follow-up 3 months after last seen.  Chart review indicates daily cannabis use.  She is here with her grandmother in law.  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   Review of Systems  Constitutional: Negative.   HENT: Negative.    Eyes:  Negative for blurred vision, discharge and redness.  Respiratory: Negative.    Cardiovascular: Negative.   Gastrointestinal:  Negative for abdominal pain.  Genitourinary: Negative.   Musculoskeletal: Negative.  Negative for myalgias.  Skin:  Negative for rash.  Neurological:  Negative for tingling, loss of consciousness and weakness.  Endo/Heme/Allergies:  Negative for polydipsia.      01/25/2024    2:09 PM 04/13/2023   10:00 AM 03/16/2023    3:50 PM  Depression screen PHQ 2/9  Decreased Interest 0 0 1  Down, Depressed, Hopeless 1 0 1  PHQ - 2 Score 1 0 2  Altered sleeping 1 1 1   Tired, decreased energy 1 1 1   Change in appetite 1 0 0  Feeling bad or failure about yourself  1 0 0  Trouble concentrating 0 0 0  Moving slowly or fidgety/restless 0 0 0  Suicidal thoughts 0 0 1  PHQ-9 Score 5 2 5   Difficult doing work/chores Not difficult at all Not difficult at all Somewhat difficult       Current Outpatient Medications:    folic acid (FOLVITE) 1 MG tablet, Take 1 tablet (1 mg total) by mouth daily., Disp: 30 tablet, Rfl: 11   lamoTRIgine (LAMICTAL) 100 MG tablet, Take 1 tablet (100 mg total) by mouth 2 (two) times daily., Disp: 180 tablet, Rfl: 3   FLUoxetine (PROZAC) 10 MG tablet, Take 1 tablet (10 mg total) by mouth daily., Disp: 90 tablet,  Rfl: 1   Objective:     BP 100/66   Pulse 73   Temp 98.6 F (37 C)   Ht 5\' 3"  (1.6 m)   Wt 136 lb 9.6 oz (62 kg)   LMP  (LMP Unknown)   SpO2 98%   BMI 24.20 kg/m    Physical Exam Constitutional:      General: She is not in acute distress.    Appearance: Normal appearance. She is not ill-appearing, toxic-appearing or diaphoretic.  HENT:     Head: Normocephalic and atraumatic.     Right Ear: External ear normal.     Left Ear: External ear normal.  Eyes:     General: No scleral icterus.       Right eye: No discharge.        Left eye: No discharge.     Extraocular Movements: Extraocular movements intact.     Conjunctiva/sclera: Conjunctivae normal.  Pulmonary:     Effort: Pulmonary effort is normal. No respiratory distress.  Skin:    General: Skin is warm and dry.  Neurological:     Mental Status: She is alert and oriented to person, place, and time.  Psychiatric:        Mood and Affect: Mood normal.  Behavior: Behavior normal.      No results found for any visits on 01/25/24.    The ASCVD Risk score (Arnett DK, et al., 2019) failed to calculate for the following reasons:   The 2019 ASCVD risk score is only valid for ages 36 to 41    Assessment & Plan:   Depression with anxiety -     FLUoxetine HCl; Take 1 tablet (10 mg total) by mouth daily.  Dispense: 90 tablet; Refill: 1    Return in about 3 months (around 04/26/2024).  Continue fluoxetine 10 mg daily.  Advised that daily marijuana use can be counterproductive regarding the best outcome of her treatment.  Advised her to stop.  Information was given on cannabis use disorder.  Mliss Sax, MD

## 2024-04-25 ENCOUNTER — Ambulatory Visit: Admitting: Family Medicine

## 2024-04-25 ENCOUNTER — Encounter: Payer: Self-pay | Admitting: Family Medicine

## 2024-04-25 VITALS — Temp 96.9°F | Ht 63.0 in | Wt 127.6 lb

## 2024-04-25 DIAGNOSIS — F418 Other specified anxiety disorders: Secondary | ICD-10-CM

## 2024-04-25 MED ORDER — FLUOXETINE HCL 10 MG PO TABS
10.0000 mg | ORAL_TABLET | Freq: Every day | ORAL | 1 refills | Status: AC
Start: 2024-04-25 — End: ?

## 2024-04-25 NOTE — Progress Notes (Addendum)
 Established Patient Office Visit   Subjective:  Patient ID: Jamie Shaw, female    DOB: [birth year]  Age: 33 y.o. MRN: [id number]  No chief complaint on file.   HPI Encounter Diagnoses  Name Primary?   Depression with anxiety Yes   Continues with fluoxetine  10 mg and Lamictal  as prescribed for seizures.  She is doing well.  Anxiety and depression are well-controlled.  She is accompanied by her husband's grandmother.  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  Things are well at home with her husband.  They are using condoms for contraception and are not intending a pregnancy anytime soon.   Review of Systems  Constitutional: Negative.   HENT: Negative.    Eyes:  Negative for blurred vision, discharge and redness.  Respiratory: Negative.    Cardiovascular: Negative.   Gastrointestinal:  Negative for abdominal pain.  Genitourinary: Negative.   Musculoskeletal: Negative.  Negative for myalgias.  Skin:  Negative for rash.  Neurological:  Negative for tingling, loss of consciousness and weakness.  Endo/Heme/Allergies:  Negative for polydipsia.      04/25/2024    2:13 PM 01/25/2024    2:09 PM 04/13/2023   10:00 AM  Depression screen PHQ 2/9  Decreased Interest 0 0 0  Down, Depressed, Hopeless 0 1 0  PHQ - 2 Score 0 1 0  Altered sleeping  1 1  Tired, decreased energy  1 1  Change in appetite  1 0  Feeling bad or failure about yourself   1 0  Trouble concentrating  0 0  Moving slowly or fidgety/restless  0 0  Suicidal thoughts  0 0  PHQ-9 Score  5 2  Difficult doing work/chores  Not difficult at all Not difficult at all      Current Outpatient Medications:    folic acid  (FOLVITE ) 1 MG tablet, Take 1 tablet (1 mg total) by mouth daily., Disp: 30 tablet, Rfl: 11   lamoTRIgine  (LAMICTAL ) 100 MG tablet, Take 1 tablet (100 mg total) by mouth 2 (two) times daily., Disp: 180 tablet, Rfl: 3   FLUoxetine  (PROZAC ) 10 MG tablet, Take 1 tablet (10 mg total) by mouth daily.,  Disp: 90 tablet, Rfl: 1   Objective:     Temp (!) 96.9 F (36.1 C) (Temporal)   Ht 5' 3 (1.6 m)   Wt 127 lb 9.6 oz (57.9 kg)   LMP 03/27/2024   BMI 22.60 kg/m    Physical Exam Constitutional:      General: She is not in acute distress.    Appearance: Normal appearance. She is not ill-appearing, toxic-appearing or diaphoretic.  HENT:     Head: Normocephalic and atraumatic.     Right Ear: External ear normal.     Left Ear: External ear normal.   Eyes:     General: No scleral icterus.       Right eye: No discharge.        Left eye: No discharge.     Extraocular Movements: Extraocular movements intact.     Conjunctiva/sclera: Conjunctivae normal.   Pulmonary:     Effort: Pulmonary effort is normal. No respiratory distress.   Skin:    General: Skin is warm and dry.   Neurological:     Mental Status: She is alert and oriented to person, place, and time.   Psychiatric:        Mood and Affect: Mood normal.        Behavior: Behavior normal.  No results found for any visits on 04/25/24.    The ASCVD Risk score (Arnett DK, et al., 2019) failed to calculate for the following reasons:   The 2019 ASCVD risk score is only valid for ages 73 to 53    Assessment & Plan:   Depression with anxiety -     FLUoxetine  HCl; Take 1 tablet (10 mg total) by mouth daily.  Dispense: 90 tablet; Refill: 1    Return in about 6 months (around 10/25/2024), or See GYN for more effective birth control., for anxiety and depression.  Continue fluoxetine  10 mg daily.  Recommended follow-up with GYN for more reliable birth control method.  Tonna Frederic, MD

## 2024-10-24 ENCOUNTER — Encounter: Payer: Self-pay | Admitting: Neurology

## 2024-10-24 ENCOUNTER — Ambulatory Visit: Admitting: Family Medicine

## 2024-10-24 ENCOUNTER — Encounter: Payer: Self-pay | Admitting: Family Medicine

## 2024-10-24 VITALS — BP 106/66 | HR 84 | Temp 98.1°F | Ht 64.0 in | Wt 131.4 lb

## 2024-10-24 DIAGNOSIS — F1210 Cannabis abuse, uncomplicated: Secondary | ICD-10-CM

## 2024-10-24 DIAGNOSIS — F418 Other specified anxiety disorders: Secondary | ICD-10-CM

## 2024-10-24 DIAGNOSIS — N912 Amenorrhea, unspecified: Secondary | ICD-10-CM | POA: Diagnosis not present

## 2024-10-24 DIAGNOSIS — Z3A01 Less than 8 weeks gestation of pregnancy: Secondary | ICD-10-CM | POA: Diagnosis not present

## 2024-10-24 LAB — POCT URINE PREGNANCY: Preg Test, Ur: POSITIVE — AB

## 2024-10-24 MED ORDER — PRENATAL VITAMIN 27-0.8 MG PO TABS: ORAL_TABLET | ORAL | 3 refills | Status: AC

## 2024-10-24 NOTE — Progress Notes (Signed)
 Established Patient Office Visit   Subjective:  Patient ID: Jamie Shaw, female    DOB: 1991/10/05  Age: 33 y.o. MRN: [id number]  Chief Complaint  Patient presents with   Follow-up    6 month follow for Anxiety and Depression     HPI Encounter Diagnoses  Name Primary?   Depression with anxiety Yes   Cannabis abuse, daily use    Amenorrhea    Less than [redacted] weeks gestation of pregnancy    Follow-up of depression with anxiety.  Continues to do well with the fluoxetine  10 mg.  Continues to live with her significant other.  They are using condoms for pregnancy prevention.  Believes that the first day of LMP was towards the end of October.  Continues daily cannabis use.  She says that it helps to settle her stomach.   Review of Systems  Constitutional: Negative.   HENT: Negative.    Eyes:  Negative for blurred vision, discharge and redness.  Respiratory: Negative.    Cardiovascular: Negative.   Gastrointestinal:  Positive for nausea. Negative for abdominal pain.  Genitourinary: Negative.   Musculoskeletal: Negative.  Negative for myalgias.  Skin:  Negative for rash.  Neurological:  Negative for tingling, loss of consciousness and weakness.  Endo/Heme/Allergies:  Negative for polydipsia.    Current Medications[1]   Objective:     BP 106/66 (BP Location: Left Arm, Patient Position: Sitting, Cuff Size: Normal)   Pulse 84   Temp 98.1 F (36.7 C) (Oral)   Ht 5' 4 (1.626 m)   Wt 131 lb 6.4 oz (59.6 kg)   LMP 08/29/2024   SpO2 98%   BMI 22.55 kg/m    Physical Exam Constitutional:      General: She is not in acute distress.    Appearance: Normal appearance. She is not ill-appearing, toxic-appearing or diaphoretic.  HENT:     Head: Normocephalic and atraumatic.     Right Ear: External ear normal.     Left Ear: External ear normal.  Eyes:     General: No scleral icterus.       Right eye: No discharge.        Left eye: No discharge.     Extraocular Movements:  Extraocular movements intact.     Conjunctiva/sclera: Conjunctivae normal.  Pulmonary:     Effort: Pulmonary effort is normal. No respiratory distress.  Skin:    General: Skin is warm and dry.  Neurological:     Mental Status: She is alert and oriented to person, place, and time.  Psychiatric:        Mood and Affect: Mood normal.        Behavior: Behavior normal.      Results for orders placed or performed in visit on 10/24/24  POCT urine pregnancy  Result Value Ref Range   Preg Test, Ur Positive (A) Negative      The ASCVD Risk score (Arnett DK, et al., 2019) failed to calculate for the following reasons:   The 2019 ASCVD risk score is only valid for ages 62 to 41   * - Cholesterol units were assumed    Assessment & Plan:   Depression with anxiety  Cannabis abuse, daily use  Amenorrhea -     POCT urine pregnancy  Less than [redacted] weeks gestation of pregnancy -     Prenatal Vitamin; Take 1 daily.  Dispense: 30 tablet; Refill: 3 -     Ambulatory referral to Obstetrics / Gynecology  Return in about 8 weeks (around 12/19/2024), or Discontinue Prozac .  Call neurology to check on Lamictal  for pregnancy..  Again expressed concern about cannabis use.  Advised that frequent use can lead to nausea and vomiting.  Please discontinue use.  Information given.   Elsie Sim Lent, MD    [1]  Current Outpatient Medications:    folic acid  (FOLVITE ) 1 MG tablet, Take 1 tablet (1 mg total) by mouth daily., Disp: 30 tablet, Rfl: 11   lamoTRIgine  (LAMICTAL ) 100 MG tablet, Take 1 tablet (100 mg total) by mouth 2 (two) times daily., Disp: 180 tablet, Rfl: 3   Prenatal Vit-Fe Fumarate-FA (PRENATAL VITAMIN) 27-0.8 MG TABS, Take 1 daily., Disp: 30 tablet, Rfl: 3

## 2024-10-25 ENCOUNTER — Ambulatory Visit: Payer: Self-pay

## 2024-10-28 ENCOUNTER — Ambulatory Visit: Payer: Self-pay

## 2024-10-28 ENCOUNTER — Other Ambulatory Visit (INDEPENDENT_AMBULATORY_CARE_PROVIDER_SITE_OTHER): Payer: Self-pay

## 2024-10-28 VITALS — BP 113/71 | HR 89

## 2024-10-28 DIAGNOSIS — Z3401 Encounter for supervision of normal first pregnancy, first trimester: Secondary | ICD-10-CM

## 2024-10-28 DIAGNOSIS — Z3480 Encounter for supervision of other normal pregnancy, unspecified trimester: Secondary | ICD-10-CM | POA: Insufficient documentation

## 2024-10-28 DIAGNOSIS — O3680X0 Pregnancy with inconclusive fetal viability, not applicable or unspecified: Secondary | ICD-10-CM

## 2024-10-28 DIAGNOSIS — Z3A01 Less than 8 weeks gestation of pregnancy: Secondary | ICD-10-CM

## 2024-10-28 NOTE — Progress Notes (Signed)
 New OB Intake  I connected with Jamie Shaw  on 10/28/2024 at  9:15 AM EST by In Person Visit and verified that I am speaking with the correct person using two identifiers. Nurse is located at [facility] and pt is located at [place].  I discussed the limitations, risks, security and privacy concerns of performing an evaluation and management service by telephone and the availability of in person appointments. I also discussed with the patient that there may be a patient responsible charge related to this service. The patient expressed understanding and agreed to proceed.  I explained I am completing New OB Intake today. We discussed EDD of 06/15/25 based on US  at [redacted]w[redacted]d weeks. Pt is G1P0. I reviewed her allergies, medications and Medical/Surgical/OB history.    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. Discussed the nature of our practice with multiple providers including residents and students as well as female and female providers. Due to the size of the practice, the delivering provider may not be the same as those providing prenatal care.   Patient Undecided interested in water birth.  MyChart/Babyscripts MyChart access verified. I explained pt will have some visits in office and some virtually. Babyscripts instructions given and order placed. Patient verifies receipt of registration text/e-mail. Account successfully created and app  downloaded. If patient is a candidate for Optimized scheduling, add to sticky note.   Blood Pressure Cuff/Weight Scale Pt has private insurance. Instructed to purchase BP Cuff. Explained after first prenatal appt pt will check weekly and document in Babyscripts. Patient does not have weight scale; patient may purchase if they desire to track weight weekly in Babyscripts.  Anatomy US  Explained first scheduled US  will be around 19 weeks. Anatomy US  scheduled for TBD at TBD.  Is patient a candidate for Babyscripts Optimization? No, due to Risk Factors   First visit review I reviewed new OB appt with patient. Explained pt will be seen by Nidia Daring, NP at first visit. Discussed [place] genetic screening with patient. Requests Panorama and Horizon.. Routine prenatal labs OB Urine collected at today's visit.Initial prenatal labs deferred to New OB appt.   Last Pap Diagnosis  Date Value Ref Range Status  08/13/2021   Final   - Negative for Intraepithelial Lesions or Malignancy (NILM)  08/13/2021 - Benign reactive/reparative changes  Final    Rocky HERO Ober, RN 10/28/2024  9:24 AM

## 2024-10-28 NOTE — Patient Instructions (Signed)

## 2024-10-30 LAB — URINE CULTURE, OB REFLEX

## 2024-10-30 LAB — CULTURE, OB URINE

## 2024-10-31 ENCOUNTER — Ambulatory Visit: Payer: Self-pay | Admitting: Obstetrics and Gynecology

## 2024-11-01 ENCOUNTER — Telehealth: Payer: Self-pay | Admitting: Neurology

## 2024-11-01 DIAGNOSIS — G40909 Epilepsy, unspecified, not intractable, without status epilepticus: Secondary | ICD-10-CM

## 2024-11-01 NOTE — Telephone Encounter (Signed)
 Patient's OB reached out for use to check and follow lamictal  levels. Please ask her to come for blood draw. Order is in. Thanks. We will monitor throughout pregnancy so ask her to come at a time that will be conducive going forward. Thanks   Orders Placed This Encounter  Procedures   Lamotrigine  level

## 2024-11-28 ENCOUNTER — Other Ambulatory Visit (HOSPITAL_COMMUNITY)
Admission: RE | Admit: 2024-11-28 | Discharge: 2024-11-28 | Disposition: A | Discharge status: Home / Self Care | Source: Ambulatory Visit | Attending: Obstetrics and Gynecology | Admitting: Obstetrics and Gynecology

## 2024-11-28 ENCOUNTER — Ambulatory Visit (INDEPENDENT_AMBULATORY_CARE_PROVIDER_SITE_OTHER): Payer: Self-pay | Admitting: Obstetrics and Gynecology

## 2024-11-28 ENCOUNTER — Encounter: Payer: Self-pay | Admitting: Obstetrics and Gynecology

## 2024-11-28 VITALS — BP 118/73 | HR 91 | Wt 135.6 lb

## 2024-11-28 DIAGNOSIS — O99341 Other mental disorders complicating pregnancy, first trimester: Secondary | ICD-10-CM | POA: Insufficient documentation

## 2024-11-28 DIAGNOSIS — Z3480 Encounter for supervision of other normal pregnancy, unspecified trimester: Secondary | ICD-10-CM | POA: Diagnosis not present

## 2024-11-28 DIAGNOSIS — O99351 Diseases of the nervous system complicating pregnancy, first trimester: Secondary | ICD-10-CM | POA: Diagnosis not present

## 2024-11-28 DIAGNOSIS — Z3A11 11 weeks gestation of pregnancy: Secondary | ICD-10-CM | POA: Diagnosis present

## 2024-11-28 DIAGNOSIS — F418 Other specified anxiety disorders: Secondary | ICD-10-CM | POA: Insufficient documentation

## 2024-11-28 DIAGNOSIS — G40909 Epilepsy, unspecified, not intractable, without status epilepticus: Secondary | ICD-10-CM | POA: Insufficient documentation

## 2024-11-28 NOTE — Progress Notes (Signed)
 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   Pt has no concerns today.

## 2024-11-28 NOTE — Progress Notes (Signed)
 "  INITIAL PRENATAL VISIT  Subjective:   Jamie Shaw is being seen today for her first obstetrical visit.  She is at [redacted]w[redacted]d gestation by u/s Her obstetrical history is significant for hx seizures and depression on lamictal  . Relationship with FOB: significant other, living together. Patient does intend to breast feed. Pregnancy history fully reviewed.  Patient reports no complaints.  Indications for ASA therapy (per uptodate)  Delivery Plans Plans to deliver at [facility]. Discussed the nature of our practice with multiple providers including residents and students as well as female and female providers. Due to the size of the practice, the delivering provider may not be the same as those providing prenatal care.     Pap smear history:  Objective:    Obstetric History OB History  Gravida Para Term Preterm AB Living  1 0 0 0 0 0  SAB IAB Ectopic Multiple Live Births  0 0 0 0 0    # Outcome Date GA Lbr Len/2nd Weight Sex Type Anes PTL Lv  1 Current             Past Medical History:  Diagnosis Date   Seizures (HCC)     Past Surgical History:  Procedure Laterality Date   NO PAST SURGERIES     no past surgery      Medications Ordered Prior to Encounter[1]  Allergies[2]  Social History:  reports that she has never smoked. She has never used smokeless tobacco. She reports that she does not currently use alcohol. She reports that she does not currently use drugs after having used the following drugs: Marijuana.  Family History  Problem Relation Age of Onset   Healthy Mother    Healthy Father     The following portions of the patient's history were reviewed and updated as appropriate: allergies, current medications, past family history, past medical history, past social history, past surgical history and problem list.  Review of Systems Review of Systems  All other systems reviewed and are negative.   Physical Exam:  BP 118/73   Pulse 91   Wt 135 lb 9.6 oz (61.5 kg)    LMP 08/29/2024 (Approximate)   BMI 23.28 kg/m  CONSTITUTIONAL: Well-developed, well-nourished female in no acute distress.  HENT:  Normocephalic, atraumatic.   SKIN: Skin is warm and dry. MUSCULOSKELETAL: Normal range of motion NEUROLOGIC: Alert and oriented  PSYCHIATRIC: Normal mood and affect. Normal behavior.  RESPIRATORY: normal effort ABDOMEN: Soft PELVIC:Pelvic: normal appearing vulva with no masses, tenderness or lesions  VAGINA: normal appearing vagina with normal color and discharge, no lesions  CERVIX: normal appearing cervix without discharge or lesions, no CMT  Thin prep pap is done w HR HPV cotesting    Extremities:  No swelling or varicosities noted   Fetal Heart Rate (bpm): 136   Movement: Absent       Assessment:    Pregnancy: G1P0000  1. Supervision of other normal pregnancy, antepartum (Primary) BP and FHR normal  Pap today   - CBC/D/Plt+RPR+Rh+ABO+RubIgG... - HgB A1c - PANORAMA PRENATAL TEST - [facility] Basic Panel  2. [redacted] weeks gestation of pregnancy  - Cytology - PAP( [place]) - Cervicovaginal ancillary only( [place])  3. Seizure disorder (HCC) Dx in 2016 for potential seizure and started keppra  MRI 2016  normal  EEG 2019 Impression: This is an abnormal study that shows a possible epileptogenic focus that arises from the right posterior temporal area.  Folic acid  1mg  daily  Seend by neurology  12/30/2023, she is aware of pregnancy  4. Depression with anxiety Continues on lamictal  for depression and no recurrent seizure like activity since 2018-2019    Plan:     Initial labs drawn. Prenatal vitamins. Problem list reviewed and updated. Reviewed in detail the nature of the practice with collaborative care between  Genetic screening discussed: NIPS/First trimester screen/Quad/AFP ordered. Role of ultrasound in pregnancy discussed; Anatomy US : results reviewed. Follow up in 4 weeks. Discussed clinic routines, schedule of care and testing,  genetic screening options, involvement of students and residents under the direct supervision of APPs and doctors and presence of female providers. Pt verbalized understanding.   Delores Nidia CROME, FNP       [1]  Current Outpatient Medications on File Prior to Visit  Medication Sig Dispense Refill   folic acid  (FOLVITE ) 1 MG tablet Take 1 tablet (1 mg total) by mouth daily. 30 tablet 11   lamoTRIgine  (LAMICTAL ) 100 MG tablet Take 1 tablet (100 mg total) by mouth 2 (two) times daily. 180 tablet 3   Prenatal Vit-Fe Fumarate-FA (PRENATAL VITAMIN) 27-0.8 MG TABS Take 1 daily. 30 tablet 3   No current facility-administered medications on file prior to visit.  [2] No Known Allergies  "

## 2024-11-29 ENCOUNTER — Ambulatory Visit: Payer: Self-pay | Admitting: Obstetrics and Gynecology

## 2024-11-29 DIAGNOSIS — Z3480 Encounter for supervision of other normal pregnancy, unspecified trimester: Secondary | ICD-10-CM

## 2024-11-29 LAB — CBC/D/PLT+RPR+RH+ABO+RUBIGG...
Antibody Screen: NEGATIVE
Basophils Absolute: 0 x10E3/uL (ref 0.0–0.2)
Basos: 0 %
EOS (ABSOLUTE): 0.1 x10E3/uL (ref 0.0–0.4)
Eos: 1 %
HCV Ab: NONREACTIVE
HIV Screen 4th Generation wRfx: NONREACTIVE
Hematocrit: 37.8 % (ref 34.0–46.6)
Hemoglobin: 13.1 g/dL (ref 11.1–15.9)
Hepatitis B Surface Ag: NEGATIVE
Immature Grans (Abs): 0 x10E3/uL (ref 0.0–0.1)
Immature Granulocytes: 0 %
Lymphocytes Absolute: 1.9 x10E3/uL (ref 0.7–3.1)
Lymphs: 23 %
MCH: 36.3 pg — ABNORMAL HIGH (ref 26.6–33.0)
MCHC: 34.7 g/dL (ref 31.5–35.7)
MCV: 105 fL — ABNORMAL HIGH (ref 79–97)
Monocytes Absolute: 0.4 x10E3/uL (ref 0.1–0.9)
Monocytes: 5 %
Neutrophils Absolute: 5.7 x10E3/uL (ref 1.4–7.0)
Neutrophils: 71 %
Platelets: 246 x10E3/uL (ref 150–450)
RBC: 3.61 x10E6/uL — ABNORMAL LOW (ref 3.77–5.28)
RDW: 11.7 % (ref 11.7–15.4)
RPR Ser Ql: NONREACTIVE
Rh Factor: POSITIVE
Rubella Antibodies, IGG: 2.74 {index}
WBC: 8.1 x10E3/uL (ref 3.4–10.8)

## 2024-11-29 LAB — CERVICOVAGINAL ANCILLARY ONLY
Bacterial Vaginitis (gardnerella): NEGATIVE
Candida Glabrata: NEGATIVE
Candida Vaginitis: NEGATIVE
Chlamydia: NEGATIVE
Comment: NEGATIVE
Comment: NEGATIVE
Comment: NEGATIVE
Comment: NEGATIVE
Comment: NEGATIVE
Comment: NORMAL
Neisseria Gonorrhea: NEGATIVE
Trichomonas: NEGATIVE

## 2024-11-29 LAB — HEMOGLOBIN A1C
Est. average glucose Bld gHb Est-mCnc: 103 mg/dL
Hgb A1c MFr Bld: 5.2 % (ref 4.8–5.6)

## 2024-11-29 LAB — CYTOLOGY - PAP
Comment: NEGATIVE
Diagnosis: NEGATIVE
High risk HPV: NEGATIVE

## 2024-11-29 LAB — HCV INTERPRETATION

## 2024-12-05 LAB — PANORAMA PRENATAL TEST FULL PANEL:PANORAMA TEST PLUS 5 ADDITIONAL MICRODELETIONS: FETAL FRACTION: 9.7

## 2024-12-05 LAB — HORIZON CUSTOM: REPORT SUMMARY: NEGATIVE

## 2024-12-26 ENCOUNTER — Ambulatory Visit: Admitting: Family Medicine

## 2024-12-27 ENCOUNTER — Encounter: Payer: Self-pay | Admitting: Physician Assistant

## 2025-01-03 ENCOUNTER — Ambulatory Visit: Payer: BC Managed Care – PPO | Admitting: Neurology

## 2025-01-23 ENCOUNTER — Ambulatory Visit

## 2025-01-23 ENCOUNTER — Other Ambulatory Visit
